# Patient Record
Sex: Female | Born: 1990
Health system: Southern US, Community
[De-identification: ages and names within clinical notes are randomized; demographics above are authoritative.]

## PROBLEM LIST (undated history)

## (undated) DIAGNOSIS — S83519A Sprain of anterior cruciate ligament of unspecified knee, initial encounter: Secondary | ICD-10-CM

## (undated) HISTORY — PX: NO PAST SURGERIES: SHX2092

## (undated) SURGICAL SUPPLY — 68 items
ANCHOR BUTTON TIGHTROPE W/FC3 (Orthopedic Implant) ×2 IMPLANT
BLADE EXCALIBUR 4.0MM X 13CM (MISCELLANEOUS) ×1
BLADE EXCALIBUR 4.0X13 (MISCELLANEOUS) ×1
BLADE SURG 15 STRL SS (BLADE) ×2
BNDG ELASTIC 6X10 VLCR STRL LF (GAUZE/BANDAGES/DRESSINGS) ×2
BNDG ELASTIC 6X5.8 VLCR STR LF (GAUZE/BANDAGES/DRESSINGS) ×2
BNDG ESMARK 6X9 LF (GAUZE/BANDAGES/DRESSINGS) ×2
BURR OVAL 8 FLU 4.0MM X 13CM (MISCELLANEOUS) ×1
BURR OVAL 8 FLU 4.0X13 (MISCELLANEOUS) ×1
CLOSURE WOUND 1/2 X4 (GAUZE/BANDAGES/DRESSINGS) ×1
COOLER ICEMAN CLASSIC (MISCELLANEOUS) ×2
COVER BACK TABLE 60X90IN (DRAPES) ×2
CUFF TOURN SGL QUICK 34 (TOURNIQUET CUFF) ×2
CUTTER TENSIONER SUT 2-0 0 FBW (INSTRUMENTS) ×2
DRAPE ARTHROSCOPY W/POUCH 90 (DRAPES) ×2
DRAPE IMP U-DRAPE 54X76 (DRAPES) ×4
DRAPE OEC MINIVIEW 54X84 (DRAPES) ×2
DRAPE U-SHAPE 47X51 STRL (DRAPES) ×2
DRSG EMULSION OIL 3X3 NADH (GAUZE/BANDAGES/DRESSINGS) ×2
DRSG PAD ABDOMINAL 8X10 ST (GAUZE/BANDAGES/DRESSINGS) ×2
DURAPREP 26ML APPLICATOR (WOUND CARE) ×2
ELECT REM PT RETURN 9FT ADLT (ELECTROSURGICAL) ×2
FLIPCUTTER III 6-12 AR-1204FF (ORTHOPEDIC DISPOSABLE SUPPLIES) ×2
GAUZE SPONGE 4X4 12PLY STRL (GAUZE/BANDAGES/DRESSINGS) ×2
GLOVE BIOGEL PI INDICATOR 7.0 (GLOVE) ×8
GLOVE ECLIPSE 7.0 STRL STRAW (GLOVE) ×4
GLOVE SKINSENSE NS SZ7.5 (GLOVE) ×4
GLOVE SURG SS PI 7.0 STRL IVOR (GLOVE) ×2
GLOVE SURG SYN 7.5  E (GLOVE) ×4
GOWN STRL REIN XL XLG (GOWN DISPOSABLE) ×2
GOWN STRL REUS W/TWL LRG LVL3 (GOWN DISPOSABLE) ×6
GOWN STRL REUS W/TWL XL LVL3 (GOWN DISPOSABLE) ×2
HARVESTER TENDON QUADPRO 9 (ORTHOPEDIC DISPOSABLE SUPPLIES) ×2
IMMOBILIZER KNEE 22 UNIV (SOFTGOODS) ×2
IMP SYS 2ND FIX PEEK 4.75X19.1 (Miscellaneous) ×2 IMPLANT
IMPL FIBERSTITCH 2-0 CVD 24DEG (Anchor) ×4 IMPLANT
IMPLANT FIBERSTICH 2-0 CVD (Anchor) ×10 IMPLANT
KIT TRANSTIBIAL (DISPOSABLE) ×2
KNEE WRAP E Z 3 GEL PACK (MISCELLANEOUS) ×2
MANIFOLD NEPTUNE II (INSTRUMENTS) ×2
NS IRRIG 1000ML POUR BTL (IV SOLUTION) ×2
PACK ARTHROSCOPY DSU (CUSTOM PROCEDURE TRAY) ×2
PACK BASIN DAY SURGERY FS (CUSTOM PROCEDURE TRAY) ×2
PAD COLD SHLDR WRAP-ON (PAD) ×2
PADDING CAST COTTON 4X4 STRL (CAST SUPPLIES) ×2
PADDING CAST COTTON 6X4 STRL (CAST SUPPLIES) ×2
PENCIL SMOKE EVACUATOR (MISCELLANEOUS) ×2
PORT APPOLLO RF 90DEGREE MULTI (SURGICAL WAND) ×2
PORTAL SKID DEVICE (INSTRUMENTS) ×2
SCREW INTERFERENCE FT BC 9X20 (Screw) ×2 IMPLANT
SHEET MEDIUM DRAPE 40X70 STRL (DRAPES) ×2
SPONGE LAP 18X18 RF (DISPOSABLE) ×2
STRIP CLOSURE SKIN 1/2X4 (GAUZE/BANDAGES/DRESSINGS) ×1
SUCTION FRAZIER HANDLE 10FR (MISCELLANEOUS) ×2
SUT ETHILON 3 0 FSL (SUTURE) ×2
SUT ETHILON 3 0 PS 1 (SUTURE) ×2
SUT FIBERWIRE #2 38 T-5 BLUE (SUTURE) ×2
SUT MNCRL AB 3-0 PS2 27 (SUTURE) ×2
SUT VIC AB 1 CTX 27 (SUTURE) ×2
SUT VIC AB 2-0 CT1 27 (SUTURE) ×8
SUT VIC AB 2-0 SH 27 (SUTURE) ×2
SUT VIC AB 3-0 SH 27 (SUTURE) ×2
SUTURETAPE 1.3 FIBERLOOP 20 ST (SUTURE) ×2
TOWEL GREEN STERILE FF (TOWEL DISPOSABLE) ×4
TUBE CONNECTING 20'X1/4 (TUBING) ×1
TUBE CONNECTING 20X1/4 (TUBING) ×1
TUBING ARTHROSCOPY IRRIG 16FT (MISCELLANEOUS) ×2
WATER STERILE IRR 1000ML POUR (IV SOLUTION) ×2

---

## 2020-05-31 DIAGNOSIS — N6002 Solitary cyst of left breast: Secondary | ICD-10-CM

## 2020-06-01 DIAGNOSIS — N6002 Solitary cyst of left breast: Secondary | ICD-10-CM

## 2020-06-01 DIAGNOSIS — D242 Benign neoplasm of left breast: Secondary | ICD-10-CM

## 2020-08-02 DIAGNOSIS — N6002 Solitary cyst of left breast: Secondary | ICD-10-CM

## 2020-08-02 DIAGNOSIS — N6001 Solitary cyst of right breast: Secondary | ICD-10-CM

## 2020-08-06 DIAGNOSIS — N6002 Solitary cyst of left breast: Secondary | ICD-10-CM

## 2020-08-06 DIAGNOSIS — N6001 Solitary cyst of right breast: Secondary | ICD-10-CM

## 2020-08-13 DIAGNOSIS — M25562 Pain in left knee: Secondary | ICD-10-CM

## 2020-08-13 DIAGNOSIS — G8929 Other chronic pain: Secondary | ICD-10-CM

## 2020-08-13 NOTE — Progress Notes (Signed)
   Office Visit Note   Patient: Kelly Vasquez           Date of Birth: Feb 25, 1991           MRN: [id number] Visit Date: 08/13/2020              Requested by: Avel Sensor, MD [address],  [place] [address] PCP: Avel Sensor, MD   Assessment & Plan: Visit Diagnoses:  1. Chronic pain of left knee     Plan: Impression is left knee medial meniscus tear.  I did discuss cortisone injection versus MRI today.  Due to the longevity of symptoms the patient would like to go ahead and proceed with MRI.  She will follow up with [place] once that has been completed.  Follow-Up Instructions: Return for after MRI.   Orders:  Orders Placed This Encounter  Procedures  . XR KNEE 3 VIEW LEFT   No orders of the defined types were placed in this encounter.     Procedures: No procedures performed   Clinical Data: No additional findings.   Subjective: Chief Complaint  Patient presents with  . 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  This began approximately 8 years ago when she was dancing with her mom.  She remembers twisting her knee and is never been the same since.  She has had intermittent swelling worse while at work and at the end of the day.  She has sharp shooting pains to the medial aspect when she pivots.  She does note occasional instability as well.  She has tried Tylenol without relief of symptoms.  No previous cortisone injection.  Review of Systems as detailed in HPI.  All others reviewed and are negative.   Objective: Vital Signs: Ht 5\' 7"  (1.702 m)   Wt 164 lb (74.4 kg)   BMI 25.69 kg/m   Physical Exam well-developed well-nourished female in no acute distress.  Alert and oriented x3.  Ortho Exam examination of the left knee shows a trace effusion.  Range of motion 0 to 120 degrees.  Marked tenderness to the anterior horn medial meniscus.  Minimal tenderness to the  lateral joint line.  No patellofemoral crepitus.  Ligaments are stable.  She is neurovascular intact distally.  Specialty Comments:  No specialty comments available.  Imaging: XR KNEE 3 VIEW LEFT  Result Date: 08/13/2020 No acute or structural abnormalities    PMFS History: There are no problems to display for this patient.  History reviewed. No pertinent past medical history.  History reviewed. No pertinent family history.  History reviewed. No pertinent surgical history. Social History   Occupational History  . Not on file  Tobacco Use  . Smoking status: Never Smoker  . Smokeless tobacco: Never Used  Substance and Sexual Activity  . Alcohol use: Not on file  . Drug use: Not on file  . Sexual activity: Not on file

## 2020-09-03 DIAGNOSIS — M25562 Pain in left knee: Secondary | ICD-10-CM

## 2020-09-03 NOTE — Progress Notes (Signed)
F/u to discuss

## 2020-09-03 NOTE — Telephone Encounter (Signed)
Patient called she stated tramadol isn't strong enough for pain she is requesting something stronger for pain call back [phone].

## 2020-09-03 NOTE — Addendum Note (Signed)
Addended by: Hortencia Pilar on: 09/03/2020 01:18 PM   Modules accepted: Orders

## 2020-09-03 NOTE — Telephone Encounter (Signed)
I spoke with patient and advised of below per Dr Junius Roads. She states she is not sure that she is able to have surgery right now since she just started her job 6 months ago and is getting married in November. She will further discuss at her follow up appt.

## 2020-09-03 NOTE — Telephone Encounter (Signed)
Will call in an anti-inflammatory.  Best to not use stronger narcotics before surgery because if her body gets used to them, then it will be hard to control pain after surgery (presuming she does indeed need surgery).

## 2020-09-03 NOTE — Telephone Encounter (Signed)
Can you advise since Ria Comment and Erlinda Hong out of office?

## 2020-09-14 DIAGNOSIS — S83242A Other tear of medial meniscus, current injury, left knee, initial encounter: Secondary | ICD-10-CM | POA: Diagnosis not present

## 2020-09-14 DIAGNOSIS — S83512A Sprain of anterior cruciate ligament of left knee, initial encounter: Secondary | ICD-10-CM | POA: Insufficient documentation

## 2020-09-14 NOTE — Telephone Encounter (Signed)
I just sent in

## 2020-09-14 NOTE — Telephone Encounter (Signed)
Patient called she stated pain is still there , she stated she cant pivot and when getting out of the car she fell she is requesting pain meds. She stated the tramadol isn't working and she has to work Midwife.call back [phone]

## 2020-09-14 NOTE — Telephone Encounter (Signed)
yes

## 2020-09-14 NOTE — Telephone Encounter (Signed)
Answered her questions

## 2020-09-14 NOTE — Telephone Encounter (Signed)
Patient has questions regarding surgery. Would like a callback.  CB # [phone]

## 2020-09-14 NOTE — Telephone Encounter (Signed)
Needs a note that she will have quarantine 3 days prior to SU. Include SU date (Oct 1st) in Note. Needs note to protect her from not loosing her job. She has been working there for only 6 months. And cannot do FMLA until she has been there for 12 months. She is really scared to loose her job. But needs to get the SU done ASAP because she does not want to have a knee replacement      Would like this emailed to [email or web address]

## 2020-09-14 NOTE — Progress Notes (Signed)
Office Visit Note   Patient: Kelly Vasquez           Date of Birth: 04-01-1991           MRN: [id number] Visit Date: 09/14/2020              Requested by: Avel Sensor, MD [address],  [place] [address] PCP: Avel Sensor, MD   Assessment & Plan: Visit Diagnoses:  1. Rupture of anterior cruciate ligament of left knee, initial encounter   2. Acute medial meniscus tear of left knee, initial encounter     Plan: Impression is chronic ACL tear and medial meniscal tear with questionable lateral meniscal tear.  These findings were reviewed with the patient in detail and a lengthy discussion on treatment options and I have recommended ACL reconstruction with medial meniscal repair versus debridement and evaluation of the lateral meniscus.  Risk benefits rehab recovery reviewed in detail.  Patient denies a history of DVT.  Anticipate return to work without restrictions at 3 to 6 months possibly sooner with restrictions.  Total face to face encounter time was greater than 25 minutes and over half of this time was spent in counseling and/or coordination of care.  Follow-Up Instructions: Return if symptoms worsen or fail to improve.   Orders:  No orders of the defined types were placed in this encounter.  No orders of the defined types were placed in this encounter.     Procedures: No procedures performed   Clinical Data: No additional findings.   Subjective: Chief Complaint  Patient presents with  . Left Knee - Pain    Kelly Vasquez comes in today for MRI review of the left knee.  She continues to have symptoms of buckling and instability especially with pivoting and planting.  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.  She states that it is very difficult for her to perform her job responsibilities because of the left knee symptoms.   Review of Systems  Constitutional: Negative.   HENT: Negative.   Eyes: Negative.   Respiratory: Negative.   Cardiovascular:  Negative.   Endocrine: Negative.   Musculoskeletal: Negative.   Neurological: Negative.   Hematological: Negative.   Psychiatric/Behavioral: Negative.   All other systems reviewed and are negative.    Objective: Vital Signs: Ht 5\' 7"  (1.702 m)   Wt 160 lb (72.6 kg)   BMI 25.06 kg/m   Physical Exam Vitals and nursing note reviewed.  Constitutional:      Appearance: She is well-developed.  Pulmonary:     Effort: Pulmonary effort is normal.  Skin:    General: Skin is warm.     Capillary Refill: Capillary refill takes less than 2 seconds.  Neurological:     Mental Status: She is alert and oriented to person, place, and time.  Psychiatric:        Behavior: Behavior normal.        Thought Content: Thought content normal.        Judgment: Judgment normal.     Ortho Exam Left knee shows a positive pivot shift.  Soft endpoint with Lachman and anterior drawer.  No joint effusion.  Normal range of motion.  Medial joint line tenderness. Specialty Comments:  No specialty comments available.  Imaging: No results found.   PMFS History: Patient Active Problem List   Diagnosis Date Noted  . Left anterior cruciate ligament tear 09/14/2020  . Acute medial meniscus tear of  left knee 09/14/2020   History reviewed. No pertinent past medical history.  History reviewed. No pertinent family history.  History reviewed. No pertinent surgical history. Social History   Occupational History  . Not on file  Tobacco Use  . Smoking status: Never Smoker  . Smokeless tobacco: Never Used  Substance and Sexual Activity  . Alcohol use: Not on file  . Drug use: Not on file  . Sexual activity: Not on file

## 2020-09-15 NOTE — Telephone Encounter (Signed)
Patient called in wanting note for documentation to be out of work 24th-30th because she has to quarantine before her surgery . Would like letter to be emailed.   [email or web address]

## 2020-09-15 NOTE — Telephone Encounter (Signed)
yes

## 2020-09-15 NOTE — Telephone Encounter (Signed)
Note emailed per patients request.

## 2020-09-16 NOTE — Telephone Encounter (Signed)
Called and sw pt and advised that letter will be at the front desk for pick up today.

## 2020-09-16 NOTE — Telephone Encounter (Signed)
Another letter written and available for pick up.

## 2020-09-16 NOTE — Telephone Encounter (Signed)
Patient called in again saying her job just called her & wants to also include how long will she be out after the surgery.

## 2020-09-16 NOTE — Telephone Encounter (Signed)
Called and sw pt and she said specifically needs the dates listed below in note. Advised this will be done today and available for pick up at the front desk.

## 2020-09-16 NOTE — Telephone Encounter (Signed)
Patient called back in saying she hasnt  received the letter for work and they need it today , says she can also come pick it up today . Call her when document is ready for pick up.

## 2020-09-17 NOTE — Progress Notes (Addendum)
      Enhanced Recovery after Surgery for Orthopedics Enhanced Recovery after Surgery is a protocol used to improve the stress on your body and your recovery after surgery.  Patient Instructions  . The night before surgery:  o No food after midnight. ONLY clear liquids after midnight  . The day of surgery (if you do NOT have diabetes):  o Drink ONE (1) Pre-Surgery Clear Ensure as directed.   o This drink was given to you during your hospital  pre-op appointment visit. o The pre-op nurse will instruct you on the time to drink the  Pre-Surgery Ensure depending on your surgery time. o Finish the drink at the designated time by the pre-op nurse.  o Nothing else to drink after completing the  Pre-Surgery Clear Ensure.  . The day of surgery (if you have diabetes): o Drink ONE (1) Gatorade 2 (G2) as directed. o This drink was given to you during your hospital  pre-op appointment visit.  o The pre-op nurse will instruct you on the time to drink the   Gatorade 2 (G2) depending on your surgery time. o Color of the Gatorade may vary. Red is not allowed. o Nothing else to drink after completing the  Gatorade 2 (G2).           Patient given CHG pre-surgical soap. Patient verbalized understanding.  If you have questions, please contact your surgeon's office.

## 2020-09-20 NOTE — Telephone Encounter (Signed)
[employer] forms received. Sent to [employer]. 

## 2020-09-21 DIAGNOSIS — Z01812 Encounter for preprocedural laboratory examination: Secondary | ICD-10-CM | POA: Diagnosis present

## 2020-09-21 DIAGNOSIS — Z20822 Contact with and (suspected) exposure to covid-19: Secondary | ICD-10-CM | POA: Diagnosis not present

## 2020-09-24 DIAGNOSIS — X58XXXA Exposure to other specified factors, initial encounter: Secondary | ICD-10-CM | POA: Diagnosis not present

## 2020-09-24 DIAGNOSIS — Z791 Long term (current) use of non-steroidal anti-inflammatories (NSAID): Secondary | ICD-10-CM | POA: Insufficient documentation

## 2020-09-24 DIAGNOSIS — S83212A Bucket-handle tear of medial meniscus, current injury, left knee, initial encounter: Secondary | ICD-10-CM | POA: Diagnosis not present

## 2020-09-24 DIAGNOSIS — S83252A Bucket-handle tear of lateral meniscus, current injury, left knee, initial encounter: Secondary | ICD-10-CM | POA: Diagnosis not present

## 2020-09-24 DIAGNOSIS — S83272A Complex tear of lateral meniscus, current injury, left knee, initial encounter: Secondary | ICD-10-CM

## 2020-09-24 DIAGNOSIS — Z79899 Other long term (current) drug therapy: Secondary | ICD-10-CM | POA: Diagnosis not present

## 2020-09-24 DIAGNOSIS — S83512A Sprain of anterior cruciate ligament of left knee, initial encounter: Secondary | ICD-10-CM

## 2020-09-24 DIAGNOSIS — S83282A Other tear of lateral meniscus, current injury, left knee, initial encounter: Secondary | ICD-10-CM | POA: Diagnosis present

## 2020-09-24 DIAGNOSIS — S83242A Other tear of medial meniscus, current injury, left knee, initial encounter: Secondary | ICD-10-CM | POA: Diagnosis present

## 2020-09-24 DIAGNOSIS — Y939 Activity, unspecified: Secondary | ICD-10-CM | POA: Diagnosis not present

## 2020-09-24 HISTORY — PX: KNEE ARTHROSCOPY WITH ANTERIOR CRUCIATE LIGAMENT (ACL) REPAIR WITH HAMSTRING GRAFT: SHX5645

## 2020-09-24 HISTORY — DX: Sprain of anterior cruciate ligament of unspecified knee, initial encounter: S83.519A

## 2020-09-24 NOTE — Discharge Instructions (Signed)
Post-operative patient instructions Knee ACL Reconstruction   Left knee ACL reconstruction with medial meniscus repair and lateral meniscus debridement.  Ice: Place intermittent ice or cooler pack over your knee, 30 minutes on and 30 minutes off. Continue this for the first 72 hours after surgery, then save ice for use after therapy sessions or on more active days.  Weight: You may place weight on your leg as your symptoms allow (with brace and crutches)  Brace: You have a knee brace locked holding your knee straight placed over your surgical dressings. Keep brace on for walking until physical therapist or physician sees your strength and leg control improve and discontinues brace. Wear brace also at night to help with knee straightening.  Crutches: Use crutches to assist in walking until told to discontinue by your physical therapist or physician. This will help to reduce pain.  Strengthening: Perform simple thigh squeezes (isometric quad contractions) and straight leg lifts as you are able (3 sets of 5 to 10 repetitions, 3 times a day). For the leg lifts, have someone support under your ankle in the beginning until you have increased strength enough to do this on your own. To help get started on thigh squeezes, place a pillow under your knee and push down on the pillow with back of knee (sometimes easier to do than with your leg fully straight).  Motion: Perform gentle knee motion as tolerated - this is gentle bending and straightening of the knee. Seated heel slides: you can start by sitting in a chair, remove your brace, and gently slide your heel back on the floor - allowing your knee to bend. Have someone help you straighten your knee (or use your other leg/foot hooked under your ankle. Also spend time working on knee straightening by placing a folded towel under your foot/heel and allow gravity to help knee fall straight (5-10 min at a time 3-4 x per day)  Dressing: Perform 1st dressing change  at 4-5 days postoperative. A moderate amount of blood tinged drainage is to be expected. So if you bleed through the dressing on the first or second day or if you have fevers, it is fine to change the dressing/check the wounds early, recover with new gauze and tegaderm (water resistant) as shown by MD, and rewrap with an ace bandage. Elevate your leg. If it bleeds through again, or if the incisions are leaking frank blood, please call the office. May change dressing every 1-2 days thereafter to check wound appearance. Many pharmacies have a similar water resistant dressing (ex 68M Nexcare).  Shower: Keep wounds dry and covered x 14 days. Do not get wound wet until sutures removed. MD has provided you with initial tegaderm dressing supplies to place over simple dry gauze applied to incisions.  Pain medication: A narcotic pain medication has been prescribed. Take as directed. Typically you need narcotic pain medication more regularly during the first 3 to 5 days after surgery. Decrease your use of the medication as the pain improves. Narcotics can sometimes cause constipation, even after a few doses. If you have problems with constipation, you can take an over the counter stool softener or light laxative. If you have persistent problems, please notify your physician's office.  Physical therapy: Additonal activity guidelines to be provided by your physician or physical therapist at follow-up visits.  Call [phone] for questions or problems. Evenings you will be forwarded to the hospital operator. Ask for the orthopaedic physician on call. Please call if you experience:  Redness, cloudy, or foul smelling drainage at the surgical site  worsening knee pain and swelling not responsive to medication  any calf pain and or swelling of the lower leg and foot  medication intolerance  temperature greater than 100.5*F, especially during the first 3-4 weeks post surgical  other questions or concerns Thank you for  allowing [place] to be a part of your care.    *1,000mg  Tylenol taken at 11:19pm*    Post Anesthesia Home Care Instructions  Activity: Get plenty of rest for the remainder of the day. A responsible individual must stay with you for 24 hours following the procedure.  For the next 24 hours, DO NOT: -Drive a car -Paediatric nurse -Drink alcoholic beverages -Take any medication unless instructed by your physician -Make any legal decisions or sign important papers.  Meals: Start with liquid foods such as gelatin or soup. Progress to regular foods as tolerated. Avoid greasy, spicy, heavy foods. If nausea and/or vomiting occur, drink only clear liquids until the nausea and/or vomiting subsides. Call your physician if vomiting continues.  Special Instructions/Symptoms: Your throat may feel dry or sore from the anesthesia or the breathing tube placed in your throat during surgery. If this causes discomfort, gargle with warm salt water. The discomfort should disappear within 24 hours.  If you had a scopolamine patch placed behind your ear for the management of post- operative nausea and/or vomiting:  1. The medication in the patch is effective for 72 hours, after which it should be removed.  Wrap patch in a tissue and discard in the trash. Wash hands thoroughly with soap and water. 2. You may remove the patch earlier than 72 hours if you experience unpleasant side effects which may include dry mouth, dizziness or visual disturbances. 3. Avoid touching the patch. Wash your hands with soap and water after contact with the patch.    Regional Anesthesia Blocks  1. Numbness or the inability to move the "blocked" extremity may last from 3-48 hours after placement. The length of time depends on the medication injected and your individual response to the medication. If the numbness is not going away after 48 hours, call your surgeon.  2. The extremity that is blocked will need to be protected until the  numbness is gone and the  Strength has returned. Because you cannot feel it, you will need to take extra care to avoid injury. Because it may be weak, you may have difficulty moving it or using it. You may not know what position it is in without looking at it while the block is in effect.  3. For blocks in the legs and feet, returning to weight bearing and walking needs to be done carefully. You will need to wait until the numbness is entirely gone and the strength has returned. You should be able to move your leg and foot normally before you try and bear weight or walk. You will need someone to be with you when you first try to ensure you do not fall and possibly risk injury.  4. Bruising and tenderness at the needle site are common side effects and will resolve in a few days.  5. Persistent numbness or new problems with movement should be communicated to the surgeon or the [facility] [place] [phone] [place] [phone]).

## 2020-09-24 NOTE — Progress Notes (Signed)
Assisted Dr. Carswell Jackson with left, ultrasound guided, adductor canal block. Side rails up, monitors on throughout procedure. See vital signs in flow sheet. Tolerated Procedure well.  

## 2020-09-24 NOTE — Telephone Encounter (Signed)
[employer] forms received. Sent to [employer]. 

## 2020-09-24 NOTE — Telephone Encounter (Signed)
Called patient no answer LMOM to return our call. We need to know what specific questions she has. She can always send [place] a [employer].

## 2020-09-24 NOTE — Transfer of Care (Signed)
Immediate Anesthesia Transfer of Care Note  Patient: Oncologist  Procedure(s) Performed: LEFT KNEE ARTHROSCOPY WITH ANTERIOR CRUCIATE LIGAMENT (ACL) RECONSTRUCTION, PARTIAL MEDIAL MENISCUS REPAIR (Left )  Patient Location: PACU  Anesthesia Type:GA combined with regional for post-op pain  Level of Consciousness: awake, alert , oriented and patient cooperative  Airway & Oxygen Therapy: Patient Spontanous Breathing and Patient connected to nasal cannula oxygen  Post-op Assessment: Report given to RN, Post -op Vital signs reviewed and stable and Patient moving all extremities  Post vital signs: Reviewed and stable  Last Vitals:  Vitals Value Taken Time  BP 121/74 09/24/20 1644  Temp    Pulse 69 09/24/20 1645  Resp 15 09/24/20 1645  SpO2 100 % 09/24/20 1645  Vitals shown include unvalidated device data.  Last Pain:  Vitals:   09/24/20 0849  TempSrc: Oral  PainSc: 0-No pain      Patients Stated Pain Goal: 3 (03/14/21 4825)  Complications: No complications documented.

## 2020-09-24 NOTE — Anesthesia Preprocedure Evaluation (Addendum)
Anesthesia Evaluation  Patient identified by MRN, date of birth, ID band Patient awake    Reviewed: Allergy & Precautions, NPO status , Patient's Chart, lab work & pertinent test results  History of Anesthesia Complications Negative for: history of anesthetic complications  Airway Mallampati: I  TM Distance: >3 FB Neck ROM: Full    Dental  (+) Dental Advisory Given, Teeth Intact   Pulmonary neg pulmonary ROS,  09/21/2020 SARS coronavirus NEG   breath sounds clear to auscultation       Cardiovascular negative cardio ROS   Rhythm:Regular Rate:Normal     Neuro/Psych negative neurological ROS     GI/Hepatic negative GI ROS, Neg liver ROS,   Endo/Other  negative endocrine ROS  Renal/GU negative Renal ROS     Musculoskeletal   Abdominal   Peds  Hematology negative hematology ROS (+)   Anesthesia Other Findings   Reproductive/Obstetrics LMP 09/07/2020                             Anesthesia Physical Anesthesia Plan  ASA: I  Anesthesia Plan: General   Post-op Pain Management: GA combined w/ Regional for post-op pain   Induction: Intravenous  PONV Risk Score and Plan: 3 and Ondansetron, Dexamethasone and Scopolamine patch - Pre-op  Airway Management Planned: LMA  Additional Equipment: None  Intra-op Plan:   Post-operative Plan:   Informed Consent: I have reviewed the patients History and Physical, chart, labs and discussed the procedure including the risks, benefits and alternatives for the proposed anesthesia with the patient or authorized representative who has indicated his/her understanding and acceptance.     Dental advisory given  Plan Discussed with: CRNA and Surgeon  Anesthesia Plan Comments: (Plan routine monitors, GA with adductor canal block for post op analgesia)       Anesthesia Quick Evaluation

## 2020-09-24 NOTE — Anesthesia Procedure Notes (Signed)
Procedure Name: LMA Insertion Date/Time: 09/24/2020 12:51 PM Performed by: Myna Bright, CRNA Pre-anesthesia Checklist: Patient identified, Emergency Drugs available, Suction available and Patient being monitored Patient Re-evaluated:Patient Re-evaluated prior to induction Oxygen Delivery Method: Circle system utilized Preoxygenation: Pre-oxygenation with 100% oxygen Induction Type: IV induction Ventilation: Mask ventilation without difficulty LMA: LMA inserted LMA Size: 4.0 Tube type: Oral Number of attempts: 1 Placement Confirmation: positive ETCO2 and breath sounds checked- equal and bilateral Tube secured with: Tape Dental Injury: Teeth and Oropharynx as per pre-operative assessment

## 2020-09-24 NOTE — H&P (Signed)
PREOPERATIVE H&P  Chief Complaint: left knee anterior cruciate ligament tear, medial meniscal tear, lateral meniscal tear  HPI: Kelly Vasquez is a 29 y.o. female who presents for surgical treatment of left knee anterior cruciate ligament tear, medial meniscal tear, lateral meniscal tear.  She denies any changes in medical history.  Past Medical History:  Diagnosis Date  . ACL tear    Past Surgical History:  Procedure Laterality Date  . NO PAST SURGERIES     Social History   Socioeconomic History  . Marital status: Single    Spouse name: Not on file  . Number of children: Not on file  . Years of education: Not on file  . Highest education level: Not on file  Occupational History  . Not on file  Tobacco Use  . Smoking status: Never Smoker  . Smokeless tobacco: Never Used  Vaping Use  . Vaping Use: Never used  Substance and Sexual Activity  . Alcohol use: Yes    Comment: occas  . Drug use: Never  . Sexual activity: Not on file  Other Topics Concern  . Not on file  Social History Narrative  . Not on file   Social Determinants of Health   Financial Resource Strain:   . Difficulty of Paying Living Expenses: Not on file  Food Insecurity:   . Worried About Charity fundraiser in the Last Year: Not on file  . Ran Out of Food in the Last Year: Not on file  Transportation Needs:   . Lack of Transportation (Medical): Not on file  . Lack of Transportation (Non-Medical): Not on file  Physical Activity:   . Days of Exercise per Week: Not on file  . Minutes of Exercise per Session: Not on file  Stress:   . Feeling of Stress : Not on file  Social Connections:   . Frequency of Communication with Friends and Family: Not on file  . Frequency of Social Gatherings with Friends and Family: Not on file  . Attends Religious Services: Not on file  . Active Member of Clubs or Organizations: Not on file  . Attends Archivist Meetings: Not on file  . Marital Status: Not  on file   History reviewed. No pertinent family history. No Known Allergies Prior to Admission medications   Medication Sig Start Date End Date Taking? Authorizing Provider  HYDROcodone-acetaminophen (NORCO) 5-325 MG tablet Take 1-2 tablets by mouth daily as needed. 09/14/20  Yes Aundra Dubin, PA-C  diclofenac (VOLTAREN) 75 MG EC tablet Take 1 tablet (75 mg total) by mouth 2 (two) times daily as needed. 08/13/20   Aundra Dubin, PA-C  traMADol (ULTRAM) 50 MG tablet Take 1 tablet (50 mg total) by mouth 2 (two) times daily as needed. 08/13/20   Aundra Dubin, PA-C     Positive ROS: All other systems have been reviewed and were otherwise negative with the exception of those mentioned in the HPI and as above.  Physical Exam: General: Alert, no acute distress Cardiovascular: No pedal edema Respiratory: No cyanosis, no use of accessory musculature GI: abdomen soft Skin: No lesions in the area of chief complaint Neurologic: Sensation intact distally Psychiatric: Patient is competent for consent with normal mood and affect Lymphatic: no lymphedema  MUSCULOSKELETAL: exam stable  Assessment: left knee anterior cruciate ligament tear, medial meniscal tear, lateral meniscal tear  Plan: Plan for Procedure(s): LEFT KNEE ARTHROSCOPY WITH ANTERIOR CRUCIATE LIGAMENT (ACL) RECONSTRUCTION, PARTIAL MEDIAL AND LATERAL MENISCECTOMY VS.  REPAIR  The risks benefits and alternatives were discussed with the patient including but not limited to the risks of nonoperative treatment, versus surgical intervention including infection, bleeding, nerve injury,  blood clots, cardiopulmonary complications, morbidity, mortality, among others, and they were willing to proceed.   Preoperative templating of the joint replacement has been completed, documented, and submitted to the Operating Room personnel in order to optimize intra-operative equipment management.   Eduard Roux, MD 09/24/2020 8:20 AM

## 2020-09-24 NOTE — Op Note (Signed)
Date of Surgery: 09/24/2020  INDICATIONS: Ms. Kelly Vasquez is a 29 y.o.-year-old female with a left knee injury who presents today for surgical treatment..  The patient did consent to the procedure after discussion of the risks and benefits.  PREOPERATIVE DIAGNOSIS:  1.  Complete rupture of left anterior cruciate ligament 2.  Bucket-handle tear of left medial meniscus 3.  Complex tear of the lateral meniscus posterior horn  POSTOPERATIVE DIAGNOSIS: Same.  PROCEDURE:  1.  Examination of left knee joint under general anesthesia 2.  Repair of left medial meniscal tear 3.  Partial left lateral meniscectomy 4.  Arthroscopically assisted reconstruction of left anterior cruciate ligament with quadriceps autograft  SURGEON: N. Eduard Roux, M.D.  ASSIST: Ciro Backer [place], [place]; necessary for the timely completion of procedure and due to complexity of procedure.  ANESTHESIA:  general  IV FLUIDS AND URINE: See anesthesia.  ESTIMATED BLOOD LOSS: 50 mL.  IMPLANTS:  1.  Arthrex fiber tag tight rope femoral flip button 2.  Arthrex 9 x 20 fast thread interference screw 3.  4.75 mm swivel lock 4.  Arthrex fiber stitch all inside meniscal device x 2  DRAINS: None  COMPLICATIONS: see description of procedure.  DESCRIPTION OF PROCEDURE: The patient was brought to the operating room.  The patient had been signed prior to the procedure and this was documented. The patient had the anesthesia placed by the anesthesiologist.  A time-out was performed to confirm that this was the correct patient, site, side and location. The patient did receive antibiotics prior to the incision and was re-dosed during the procedure as needed at indicated intervals.  A tourniquet was placed on the upper thigh.  The patient had the operative extremity prepped and draped in the standard surgical fashion.    We first began with examination of the knee joint under general anesthesia.  She had abnormal pivot shift, Lachman,  anterior drawer with no endpoint.  Collateral exam was normal.  Posterior lateral corner was normal. We then turned our attention to the surgical portion.  Incisions were made for standard knee arthroscopy portals.  Diagnostic knee arthroscopy was performed and revealed a bucket handle tear of the medial meniscus that was unstable to probing.  Lateral meniscus demonstrated a small complex tear the posterior horn with a horizontal cleavage component.  Limited synovectomy was performed for visualization.  The knee was placed in a figure 4 position and a partial lateral meniscectomy was performed using a meniscus basket and oscillating shaver back to stable border.  Less than 20% of the volume of the lateral meniscus was removed.  She did demonstrate grade 2 changes of the lateral femoral condyle of the weightbearing surface.  The patellofemoral compartment was unremarkable.  The anterior cruciate ligament was fully ruptured and the stump was debrided away.  Gentle valgus force was then placed on the left knee to address the medial compartment.  The bucket-handle tear extended from the mid body back to the posterior horn when probed.  An all inside meniscal repair was performed using the Arthrex fiber stitch meniscal device.  We used 2 devices in order to deploy 4 anchors.  The meniscus reduced quite nicely and was stable to probing afterwards.  She demonstrated a grade 3 changes of the medial femoral condyle of the weightbearing surface.  Gentle chondroplasty was performed in this area.  We then turned our attention to the ACL reconstruction.  Incision was made directly over the distal quadriceps and the knee was placed on a  radiolucent triangle.  Dissection was carried down through the subcutaneous tissue and the soft tissue was cleared off of the quadriceps tendon.  A 9.5 by 80 mm quadriceps graft was obtained.  The defect was then repaired using interrupted #1 Vicryl sutures.  The harvest site was closed in a  layered fashion.  The ACL graft was then prepared on the back table by my PA who was critical for all parts of the surgery.  The arthroscope was then placed back into the knee and femoral notchplasty was performed.  The femoral tunnel was then drilled with a flip cutter at a 9.5 mm diameter and a depth of 25 mm.  The tibial tunnel was then drilled at 45mm.  The graft was then passed transtibially under arthroscopic visualization.  The femoral button was flipped and confirmed under fluoroscopy.  The ACL graft was then advanced into the femoral tunnel to the proper depth.  The knee was then put through 10 cycles.  With proper tension the interference screw was placed into the tibial tunnel with excellent fixation.  The tibial fixation was then backed up with a 4.75 mm swivel lock into the tibial cortex with excellent fixation.  All wounds were then thoroughly irrigated.  Examination of the knee revealed a negative pivot shift and a normal Lachman and anterior drawer test with solid endpoint.  All surgical wounds were closed in a layered fashion.  Sterile dressings were applied.  Patient tolerated procedure well had no immediate complications.  POSTOPERATIVE PLAN: Weight-bear as tolerated in knee immobilizer and crutches.  Follow-up next week for wound check.  Azucena Cecil, MD 7:00 PM

## 2020-09-24 NOTE — Anesthesia Procedure Notes (Signed)
Anesthesia Regional Block: Adductor canal block   Pre-Anesthetic Checklist: ,, timeout performed, Correct Patient, Correct Site, Correct Laterality, Correct Procedure, Correct Position, site marked, Risks and benefits discussed,  Surgical consent,  Pre-op evaluation,  At surgeon's request and post-op pain management  Laterality: Left and Lower  Prep: chloraprep       Needles:  Injection technique: Single-shot  Needle Type: Echogenic Needle     Needle Length: 9cm  Needle Gauge: 21     Additional Needles:   Procedures:,,,, ultrasound used (permanent image in chart),,,,  Narrative:  Start time: 09/24/2020 11:19 AM End time: 09/24/2020 11:25 AM Injection made incrementally with aspirations every 5 mL.  Performed by: Personally  Anesthesiologist: Annye Asa, MD  Additional Notes: Pt identified in Holding room.  Monitors applied. Working IV access confirmed. Sterile prep L thigh.  #21ga ECHOgenic Arrow block needle into adductor canal with US guidance.  20cc 0.75% Ropivacaine injected incrementally after negative test dose.  Patient asymptomatic, VSS, no heme aspirated, tolerated well.  Jenita Seashore, MD

## 2020-09-25 NOTE — Anesthesia Postprocedure Evaluation (Signed)
Anesthesia Post Note  Patient: Oncologist  Procedure(s) Performed: LEFT KNEE ARTHROSCOPY WITH ANTERIOR CRUCIATE LIGAMENT (ACL) RECONSTRUCTION, PARTIAL MEDIAL MENISCUS REPAIR (Left )     Patient location during evaluation: PACU Anesthesia Type: General Level of consciousness: awake and alert Pain management: pain level controlled Vital Signs Assessment: post-procedure vital signs reviewed and stable Respiratory status: spontaneous breathing, nonlabored ventilation, respiratory function stable and patient connected to nasal cannula oxygen Cardiovascular status: blood pressure returned to baseline and stable Postop Assessment: no apparent nausea or vomiting Anesthetic complications: no   No complications documented.  Last Vitals:  Vitals:   09/24/20 1730 09/24/20 1746  BP: 110/80 112/82  Pulse: 77 73  Resp: 20 18  Temp:  36.4 C  SpO2: 100% 100%    Last Pain:  Vitals:   09/24/20 1746  TempSrc:   PainSc: [place] Kelly Vasquez

## 2020-09-27 NOTE — Telephone Encounter (Signed)
Toradol cannot be refilled as it is not good to take for more than 5 days continuously.  I'll send in advill 800 mg instead

## 2020-09-29 NOTE — Telephone Encounter (Signed)
See message. Please call patient. She is requesting CB from Dr. Erlinda Hong.

## 2020-09-29 NOTE — Telephone Encounter (Signed)
Called her and answered her questions

## 2020-10-01 DIAGNOSIS — S83512A Sprain of anterior cruciate ligament of left knee, initial encounter: Secondary | ICD-10-CM

## 2020-10-01 NOTE — Progress Notes (Signed)
   Post-Op Visit Note   Patient: Kelly Vasquez           Date of Birth: 09-23-1991           MRN: [id number] Visit Date: 10/01/2020 PCP: Avel Sensor, MD   Assessment & Plan:  Chief Complaint:  Chief Complaint  Patient presents with  . Left Knee - Follow-up    Left knee scope with ACL reconstruction 09/24/2020   Visit Diagnoses:  1. Rupture of anterior cruciate ligament of left knee, initial encounter     Plan: Patient is a pleasant 29 year old female who comes in today 1 week out left knee arthroscopic debridement, medial meniscus repair and ACL reconstruction with quadriceps tendon autograft.  She has been doing fairly well.  She has not been wearing her knee immobilizer.  She has been elevating and icing for pain and swelling.  Examination of her left knee reveals moderate swelling.  Well-healed surgical incisions without complication.  She does have decreased sensation to the shin.  EHL/FHL intact.  Calf is soft nontender.  She is neurovascular intact distally.  At this point, she has been instructed to wear her knee immobilizer.  She will continue to ice and elevate for pain and swelling.  We will go ahead and start her in physical therapy and allow them to wean her out of the knee immobilizer when she has sufficient quadricep strength and is able to straight leg raise.  I have refilled her Norco.  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  I told her that we can keep her out of work through 1 January.  She is submitted paperwork to our office so we will fill that out appropriately.  Follow-up with [place] in 5 weeks time for recheck.  Call with concerns.  Follow-Up Instructions: Return in about 5 weeks (around 11/05/2020).   Orders:  Orders Placed This Encounter  Procedures  . Ambulatory referral to Physical Therapy   Meds ordered this encounter  Medications  . HYDROcodone-acetaminophen (NORCO) 5-325 MG tablet    Sig:  Take 1 tablet by mouth 3 (three) times daily as needed.    Dispense:  30 tablet    Refill:  0    Imaging: No new imaging  PMFS History: Patient Active Problem List   Diagnosis Date Noted  . Left anterior cruciate ligament tear 09/14/2020  . Acute medial meniscus tear of left knee 09/14/2020   Past Medical History:  Diagnosis Date  . ACL tear     History reviewed. No pertinent family history.  Past Surgical History:  Procedure Laterality Date  . KNEE ARTHROSCOPY WITH ANTERIOR CRUCIATE LIGAMENT (ACL) REPAIR WITH HAMSTRING GRAFT Left 09/24/2020   Procedure: LEFT KNEE ARTHROSCOPY WITH ANTERIOR CRUCIATE LIGAMENT (ACL) RECONSTRUCTION, PARTIAL MEDIAL MENISCUS REPAIR;  Surgeon: Leandrew Koyanagi, MD;  Location: [facility];  Service: Orthopedics;  Laterality: Left;  . NO PAST SURGERIES     Social History   Occupational History  . Not on file  Tobacco Use  . Smoking status: Never Smoker  . Smokeless tobacco: Never Used  Vaping Use  . Vaping Use: Never used  Substance and Sexual Activity  . Alcohol use: Yes    Comment: occas  . Drug use: Never  . Sexual activity: Not on file

## 2020-10-04 DIAGNOSIS — M25562 Pain in left knee: Secondary | ICD-10-CM

## 2020-10-04 DIAGNOSIS — R2689 Other abnormalities of gait and mobility: Secondary | ICD-10-CM | POA: Diagnosis not present

## 2020-10-04 DIAGNOSIS — R2681 Unsteadiness on feet: Secondary | ICD-10-CM

## 2020-10-04 DIAGNOSIS — M6281 Muscle weakness (generalized): Secondary | ICD-10-CM

## 2020-10-04 DIAGNOSIS — M25662 Stiffness of left knee, not elsewhere classified: Secondary | ICD-10-CM

## 2020-10-04 DIAGNOSIS — R600 Localized edema: Secondary | ICD-10-CM | POA: Diagnosis not present

## 2020-10-04 NOTE — Therapy (Signed)
[facility] [address] [place], [place], [address] Phone: [phone]   Fax:  [phone]  Physical Therapy Evaluation  Patient Details  Name: Kelly Vasquez MRN: [id number] Date of Birth: 07/17/1991 Referring Provider (PT): Dr. Erlinda Hong   Encounter Date: 10/04/2020   PT End of Session - 10/04/20 1419    Visit Number 1    Number of Visits 16    Date for PT Re-Evaluation 11/29/20    PT Start Time 1342    PT Stop Time 1415    PT Time Calculation (min) 33 min    Activity Tolerance Patient tolerated treatment well    Behavior During Therapy [facility] for tasks assessed/performed           Past Medical History:  Diagnosis Date  . ACL tear     Past Surgical History:  Procedure Laterality Date  . KNEE ARTHROSCOPY WITH ANTERIOR CRUCIATE LIGAMENT (ACL) REPAIR WITH HAMSTRING GRAFT Left 09/24/2020   Procedure: LEFT KNEE ARTHROSCOPY WITH ANTERIOR CRUCIATE LIGAMENT (ACL) RECONSTRUCTION, PARTIAL MEDIAL MENISCUS REPAIR;  Surgeon: Leandrew Koyanagi, MD;  Location: [facility];  Service: Orthopedics;  Laterality: Left;  . NO PAST SURGERIES      There were no vitals filed for this visit.    Subjective Assessment - 10/04/20 1344    Subjective Pt is a 29 y/o female who presents to OPPT s/p Lt ACL and medial meniscus repair on 09/24/20.  Pt reports initial injury about 8-9 years ago, with episodes of instability throughout the past few years.    Limitations Standing;Walking    Patient Stated Goals regain motion, back to work, dance at wedding in Nov, sleep on side    Currently in Pain? Yes    Pain Score 5     Pain Location Knee    Pain Orientation Left    Pain Descriptors / Indicators Aching;Numbness    Pain Type Acute pain;Surgical pain    Pain Onset 1 to 4 weeks ago    Pain Frequency Constant    Aggravating Factors  bending, extension    Pain Relieving Factors motrin              [facility] PT Assessment - 10/04/20 1340      Assessment    Medical Diagnosis Lt ACL repair, medial meniscus repair    Referring Provider (PT) Dr. Erlinda Hong    Onset Date/Surgical Date 09/24/20    Hand Dominance Right    Next MD Visit not scheduled    Prior Therapy n/a      Precautions   Required Braces or Orthoses Knee Immobilizer - Left    Knee Immobilizer - Left Discontinue once straight leg raise with < 10 degree lag   pt not using correct KI     Restrictions   Weight Bearing Restrictions No      Balance Screen   Has the patient fallen in the past 6 months Yes    How many times? 1 - when knee buckled prior to surgery    Has the patient had a decrease in activity level because of a fear of falling?  No    Is the patient reluctant to leave their home because of a fear of falling?  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, riding bike' exercise: jogging/riding bike      Cognition   Overall Cognitive Status Within Functional Limits for tasks assessed      Observation/Other Assessments   Observations moderate swelling in Lt knee    Focus on Therapeutic Outcomes (FOTO)  39 (61% limited; predicted 24% limited)      ROM / Strength   AROM / PROM / Strength AROM;PROM;Strength      AROM   AROM Assessment Site Knee    Right/Left Knee Right;Left    Right Knee Extension 3    Right Knee Flexion 135    Left Knee Extension -3    Left Knee Flexion 76      PROM   PROM Assessment Site Knee    Right/Left Knee Left    Left Knee Extension -1    Left Knee Flexion 90      Strength   Overall Strength Comments ~ 5 deg extension lag with SLR, needed 3  reps of AA for pt to perform SLR independently      Ambulation/Gait   Gait Pattern Step-to pattern;Decreased stance time - left;Decreased step length - right;Decreased hip/knee flexion - left;Antalgic    Gait velocity decreased    Gait Comments using knee brace (not KI that MD recommended)                      Objective measurements completed on examination: See above findings.               PT Education - 10/04/20 1419    Education Details HEP    Person(s) Educated Patient    Methods Explanation;Demonstration;Handout    Comprehension Verbalized understanding;Need further instruction            PT Short Term Goals - 10/04/20 1428      PT SHORT TERM GOAL #1   Title demonstrate ability to perform SLR x 10 reps without extensor lag for improved strength to d/c immobilizer    Status New    Target Date 11/01/20             PT Long Term Goals - 10/04/20 1429      PT LONG TERM GOAL #1   Title independent with HEP    Status New    Target Date 11/29/20      PT LONG TERM GOAL #2   Title improve Lt knee AROM0-130 for improved function    Status New    Target Date 11/29/20      PT LONG TERM GOAL #3   Title amb without significant gait deviations for improved mobility    Status New    Target Date 11/29/20      PT LONG TERM GOAL #4   Title perform SL reach test for functional strength assessment    Status New    Target Date 11/29/20      PT LONG TERM GOAL #5   Title FOTO score improved to </= 40% limited for improved function    Status New    Target Date 11/29/20                  Plan - 10/04/20 1420  Clinical Impression Statement Pt is a 29 y/o female who presents to OPPT s/p Lt ACL repair with medial meniscus repair on 09/24/20 for chronic ACL tear.  Pt demonstrates increased post-op swelling, decreased strength and ROM as well as gait abnormalities affecting funtional mobility.  Pt will benefit from PT to address deficits listed.      Personal Factors and Comorbidities Time since onset of injury/illness/exacerbation    Examination-Activity Limitations Sit;Bathing;Bed Mobility;Sleep;Bend;Squat;Stairs;Caring for Others;Stand;Transfers;Lift;Locomotion Level    Examination-Participation Restrictions Cleaning;Community Activity;Driving;Occupation    Stability/Clinical Decision Making Stable/Uncomplicated    Clinical Decision Making Low    Rehab Potential Good    PT Frequency 3x / week   2-3x/wk   PT Duration 8 weeks    PT Treatment/Interventions ADLs/Self Care Home Management;Cryotherapy;Electrical Stimulation;Moist Heat;Ultrasound;Gait training;Stair training;Functional mobility training;Therapeutic activities;Therapeutic exercise;Balance training;Manual techniques;Patient/family education;Neuromuscular re-education;Scar mobilization;Passive range of motion;Taping;Vasopneumatic Device    PT Next Visit Plan BFR, work on ROM    PT Home Exercise Plan Access Code: [email or web address]    Consulted and Agree with Plan of Care Patient           Patient will benefit from skilled therapeutic intervention in order to improve the following deficits and impairments:  Abnormal gait, Pain, Decreased strength, Difficulty walking, Decreased range of motion, Decreased mobility, Decreased balance, Decreased activity tolerance, Increased edema  Visit Diagnosis: Acute pain of left knee - Plan: PT plan of care cert/re-cert  Stiffness of left knee, not elsewhere classified - Plan: PT plan of care cert/re-cert  Other abnormalities of gait and mobility - Plan: PT plan of care cert/re-cert  Localized edema - Plan: PT plan of care cert/re-cert  Muscle weakness (generalized) - Plan: PT plan of care cert/re-cert  Unsteadiness on feet - Plan: PT plan of care cert/re-cert     Problem List Patient Active Problem List   Diagnosis Date Noted  . Left anterior cruciate ligament tear 09/14/2020  . Acute medial meniscus tear of left knee 09/14/2020       Laureen Abrahams, PT, DPT 10/04/20 2:32 PM     [facility] [address] [place], [place], [address] Phone: [phone]   Fax:  [phone]  Name: Mazella Deen MRN: [id number] Date of Birth: 03/11/1991

## 2020-10-04 NOTE — Patient Instructions (Signed)
Access Code: [facility] URL: [email or web address] Date: 10/04/2020 Prepared by: Faustino Congress  Exercises Supine Quadricep Sets - 5-7 x daily - 7 x weekly - 3 sets - 10 reps Active Straight Leg Raise with Quad Set - 2-3 x daily - 7 x weekly - 2-3 sets - 10 reps Supine Heel Slide with Strap - 2-3 x daily - 7 x weekly - 2-3 sets - 10 reps Sidelying Hip Abduction - 2-3 x daily - 7 x weekly - 2-3 sets - 10 reps Prone Hip Extension - 2-3 x daily - 7 x weekly - 2-3 sets - 10 reps Sidelying Hip Adduction - 2-3 x daily - 7 x weekly - 2-3 sets - 10 reps

## 2020-10-11 DIAGNOSIS — M6281 Muscle weakness (generalized): Secondary | ICD-10-CM

## 2020-10-11 DIAGNOSIS — M25562 Pain in left knee: Secondary | ICD-10-CM

## 2020-10-11 DIAGNOSIS — R2681 Unsteadiness on feet: Secondary | ICD-10-CM

## 2020-10-11 DIAGNOSIS — R600 Localized edema: Secondary | ICD-10-CM | POA: Diagnosis not present

## 2020-10-11 DIAGNOSIS — M25662 Stiffness of left knee, not elsewhere classified: Secondary | ICD-10-CM

## 2020-10-11 DIAGNOSIS — R2689 Other abnormalities of gait and mobility: Secondary | ICD-10-CM | POA: Diagnosis not present

## 2020-10-11 NOTE — Therapy (Signed)
[facility] [place], [place], [address] Phone: [phone]   Fax:  [phone]  Physical Therapy Treatment  Patient Details  Name: Kelly Vasquez MRN: [id number] Date of Birth: 05-03-1991 Referring Provider (PT): Dr. Erlinda Hong   Encounter Date: 10/11/2020   PT End of Session - 10/11/20 1012    Visit Number 2    Number of Visits 16    Date for PT Re-Evaluation 11/29/20    Progress Note Due on Visit 10    PT Start Time 1013    PT Stop Time 1054    PT Time Calculation (min) 41 min    Activity Tolerance Patient tolerated treatment well    Behavior During Therapy [facility] for tasks assessed/performed           Past Medical History:  Diagnosis Date  . ACL tear     Past Surgical History:  Procedure Laterality Date  . KNEE ARTHROSCOPY WITH ANTERIOR CRUCIATE LIGAMENT (ACL) REPAIR WITH HAMSTRING GRAFT Left 09/24/2020   Procedure: LEFT KNEE ARTHROSCOPY WITH ANTERIOR CRUCIATE LIGAMENT (ACL) RECONSTRUCTION, PARTIAL MEDIAL MENISCUS REPAIR;  Surgeon: Leandrew Koyanagi, MD;  Location: [place];  Service: Orthopedics;  Laterality: Left;  . NO PAST SURGERIES      There were no vitals filed for this visit.   Subjective Assessment - 10/11/20 1018    Subjective Pt. indicated pain c bending knee, pain reported in back of knee.  Pt. stated buckling also happens.    Limitations Standing;Walking    Patient Stated Goals regain motion, back to work, dance at wedding in Nov, sleep on side    Currently in Pain? No/denies   no pain at rest   Pain Onset 1 to 4 weeks ago    Aggravating Factors  bending, buckling in walking/standing                             [facility] Adult PT Treatment/Exercise - 10/11/20 0001      Exercises   Exercises Knee/Hip;Other Exercises    Other Exercises  BFR Lt LE supine size 4, LOP 182 mmHg      Knee/Hip Exercises: Aerobic   Nustep Lvl 6 7 mins      Knee/Hip Exercises: Standing   Lateral Step Up  Left;Step Height: 4"   BFR 140 mmHg x 30, x 15, x15, x15,     Knee/Hip Exercises: Seated   Hamstring Curl Left;2 sets;15 reps   green band     Knee/Hip Exercises: Supine   Straight Leg Raises Left   BFR 140 mmHg x30, x12, x15, x15     Manual Therapy   Manual therapy comments Lt knee flexion MWM c distraction, mild IR while in supine                  PT Education - 10/11/20 1032    Education Details BFR education    Person(s) Educated Patient    Methods Explanation;Demonstration    Comprehension Returned demonstration;Verbalized understanding            PT Short Term Goals - 10/04/20 1428      PT SHORT TERM GOAL #1   Title demonstrate ability to perform SLR x 10 reps without extensor lag for improved strength to d/c immobilizer    Status New    Target Date 11/01/20             PT Long Term Goals - 10/04/20 1429  PT LONG TERM GOAL #1   Title independent with HEP    Status New    Target Date 11/29/20      PT LONG TERM GOAL #2   Title improve Lt knee AROM0-130 for improved function    Status New    Target Date 11/29/20      PT LONG TERM GOAL #3   Title amb without significant gait deviations for improved mobility    Status New    Target Date 11/29/20      PT LONG TERM GOAL #4   Title perform SL reach test for functional strength assessment    Status New    Target Date 11/29/20      PT LONG TERM GOAL #5   Title FOTO score improved to </= 40% limited for improved function    Status New    Target Date 11/29/20                 Plan - 10/11/20 1032    Clinical Impression Statement Good overall tolerance to BFR training intervention c normal fatigue noted c intervention. Pt. to benefit from continued skilled PT services to improve range of motion, strength and balance.    Personal Factors and Comorbidities Time since onset of injury/illness/exacerbation    Examination-Activity Limitations Sit;Bathing;Bed  Mobility;Sleep;Bend;Squat;Stairs;Caring for Others;Stand;Transfers;Lift;Locomotion Level    Examination-Participation Restrictions Cleaning;Community Activity;Driving;Occupation    Stability/Clinical Decision Making Stable/Uncomplicated    Rehab Potential Good    PT Frequency 3x / week   2-3x/wk   PT Duration 8 weeks    PT Treatment/Interventions ADLs/Self Care Home Management;Cryotherapy;Electrical Stimulation;Moist Heat;Ultrasound;Gait training;Stair training;Functional mobility training;Therapeutic activities;Therapeutic exercise;Balance training;Manual techniques;Patient/family education;Neuromuscular re-education;Scar mobilization;Passive range of motion;Taping;Vasopneumatic Device    PT Next Visit Plan BFR, static balance,    PT Home Exercise Plan Access Code: [email or web address]    Consulted and Agree with Plan of Care Patient           Patient will benefit from skilled therapeutic intervention in order to improve the following deficits and impairments:  Abnormal gait, Pain, Decreased strength, Difficulty walking, Decreased range of motion, Decreased mobility, Decreased balance, Decreased activity tolerance, Increased edema  Visit Diagnosis: Acute pain of left knee  Stiffness of left knee, not elsewhere classified  Other abnormalities of gait and mobility  Localized edema  Muscle weakness (generalized)  Unsteadiness on feet     Problem List Patient Active Problem List   Diagnosis Date Noted  . Left anterior cruciate ligament tear 09/14/2020  . Acute medial meniscus tear of left knee 09/14/2020    Scot Jun, PT, DPT, OCS, ATC 10/11/20  10:59 AM    [facility] [address] [place], [place], [address] Phone: [phone]   Fax:  [phone]  Name: Kelly Vasquez MRN: [id number] Date of Birth: 12/01/1991

## 2020-10-13 DIAGNOSIS — M25562 Pain in left knee: Secondary | ICD-10-CM

## 2020-10-13 DIAGNOSIS — M25662 Stiffness of left knee, not elsewhere classified: Secondary | ICD-10-CM

## 2020-10-13 DIAGNOSIS — M6281 Muscle weakness (generalized): Secondary | ICD-10-CM

## 2020-10-13 DIAGNOSIS — R600 Localized edema: Secondary | ICD-10-CM

## 2020-10-13 DIAGNOSIS — R2689 Other abnormalities of gait and mobility: Secondary | ICD-10-CM | POA: Diagnosis not present

## 2020-10-13 DIAGNOSIS — R2681 Unsteadiness on feet: Secondary | ICD-10-CM

## 2020-10-13 NOTE — Telephone Encounter (Signed)
Patient called requesting an updated out of work note. Please fax her employer updated work note and call her at [phone].

## 2020-10-13 NOTE — Telephone Encounter (Signed)
Note made.  

## 2020-10-13 NOTE — Telephone Encounter (Signed)
Out of work until follow up appointment

## 2020-10-13 NOTE — Therapy (Signed)
[place], [place], [address] Phone: [phone]   Fax:  [phone]  Physical Therapy Treatment  Patient Details  Name: Kelly Vasquez MRN: [id number] Date of Birth: 30-Jan-1991 Referring Provider (PT): Dr. Erlinda Hong   Encounter Date: 10/13/2020   PT End of Session - 10/13/20 1422    Visit Number 3    Number of Visits 16    Date for PT Re-Evaluation 11/29/20    Progress Note Due on Visit 10    PT Start Time 1426    Activity Tolerance Patient tolerated treatment well    Behavior During Therapy [facility] for tasks assessed/performed           Past Medical History:  Diagnosis Date  . ACL tear     Past Surgical History:  Procedure Laterality Date  . KNEE ARTHROSCOPY WITH ANTERIOR CRUCIATE LIGAMENT (ACL) REPAIR WITH HAMSTRING GRAFT Left 09/24/2020   Procedure: LEFT KNEE ARTHROSCOPY WITH ANTERIOR CRUCIATE LIGAMENT (ACL) RECONSTRUCTION, PARTIAL MEDIAL MENISCUS REPAIR;  Surgeon: Leandrew Koyanagi, MD;  Location: [place];  Service: Orthopedics;  Laterality: Left;  . NO PAST SURGERIES      There were no vitals filed for this visit.   Subjective Assessment - 10/13/20 1428    Subjective Pt. indicated feeling tired and muscle sore after last visit.  felt her knee bends a bit more now.    Limitations Standing;Walking    Patient Stated Goals regain motion, back to work, dance at wedding in Nov, sleep on side    Currently in Pain? No/denies    Pain Onset 1 to 4 weeks ago                             [facility] Adult PT Treatment/Exercise - 10/13/20 0001      Neuro Re-ed    Neuro Re-ed Details  lateral stepping 3 cones x 10 bilateral, rocker board side to side, fwd back balance 1 min each      Exercises   Other Exercises  BFR Lt LE supine size 4, LOP 182 mmHg      Knee/Hip Exercises: Aerobic   Recumbent Bike Lvl 2 10 mins full rev      Knee/Hip Exercises: Machines for Strengthening   Total Gym Leg Press  Rt 100 lbs x25, Lt c BFR 140 mmHg 37 lbs x 30, x15, x15, x15                    PT Short Term Goals - 10/04/20 1428      PT SHORT TERM GOAL #1   Title demonstrate ability to perform SLR x 10 reps without extensor lag for improved strength to d/c immobilizer    Status New    Target Date 11/01/20             PT Long Term Goals - 10/04/20 1429      PT LONG TERM GOAL #1   Title independent with HEP    Status New    Target Date 11/29/20      PT LONG TERM GOAL #2   Title improve Lt knee AROM0-130 for improved function    Status New    Target Date 11/29/20      PT LONG TERM GOAL #3   Title amb without significant gait deviations for improved mobility    Status New    Target Date 11/29/20      PT LONG  TERM GOAL #4   Title perform SL reach test for functional strength assessment    Status New    Target Date 11/29/20      PT LONG TERM GOAL #5   Title FOTO score improved to </= 40% limited for improved function    Status New    Target Date 11/29/20                 Plan - 10/13/20 1451    Clinical Impression Statement Additional guidances and cues on motor planning improvement for neuro re ed.   Otherwise, no negative reaction to intervention today. Continued skilled PT services indicated for impairments.    Personal Factors and Comorbidities Time since onset of injury/illness/exacerbation    Examination-Activity Limitations Sit;Bathing;Bed Mobility;Sleep;Bend;Squat;Stairs;Caring for Others;Stand;Transfers;Lift;Locomotion Level    Examination-Participation Restrictions Cleaning;Community Activity;Driving;Occupation    Stability/Clinical Decision Making Stable/Uncomplicated    Rehab Potential Good    PT Frequency 3x / week   2-3x/wk   PT Duration 8 weeks    PT Treatment/Interventions ADLs/Self Care Home Management;Cryotherapy;Electrical Stimulation;Moist Heat;Ultrasound;Gait training;Stair training;Functional mobility training;Therapeutic  activities;Therapeutic exercise;Balance training;Manual techniques;Patient/family education;Neuromuscular re-education;Scar mobilization;Passive range of motion;Taping;Vasopneumatic Device    PT Next Visit Plan BFR, static balance on compliant/non compliant surface    PT Home Exercise Plan Access Code: [email or web address]    Consulted and Agree with Plan of Care Patient           Patient will benefit from skilled therapeutic intervention in order to improve the following deficits and impairments:  Abnormal gait, Pain, Decreased strength, Difficulty walking, Decreased range of motion, Decreased mobility, Decreased balance, Decreased activity tolerance, Increased edema  Visit Diagnosis: Acute pain of left knee  Stiffness of left knee, not elsewhere classified  Other abnormalities of gait and mobility  Localized edema  Muscle weakness (generalized)  Unsteadiness on feet     Problem List Patient Active Problem List   Diagnosis Date Noted  . Left anterior cruciate ligament tear 09/14/2020  . Acute medial meniscus tear of left knee 09/14/2020   Scot Jun, PT, DPT, OCS, ATC 10/13/20  3:04 PM    [facility] [address] [place], [place], [address] Phone: [phone]   Fax:  [phone]  Name: Kelly Vasquez MRN: [id number] Date of Birth: 1991-09-18

## 2020-10-14 NOTE — Telephone Encounter (Signed)
Ic,lmvm advised [employer] mailed packet on 9/29 instructing of $25.00 form fee and needed auth. I reiterated those things are needed for forms to be completed. Advised can bring to our office or take to [employer],left their address

## 2020-10-14 NOTE — Telephone Encounter (Signed)
Pt states she believes she over did it @ PT as she is no longer able to get around her home and she was getting around just fine before PT and so she would like to know if she could get hydrocodone-tylenol sent in? Pt would like a CB with an update  [phone]

## 2020-10-15 NOTE — Telephone Encounter (Signed)
Sent her a mychart message to see if she wants me to fax somewhere. If so, we will need a fax #.

## 2020-10-15 NOTE — Telephone Encounter (Signed)
I called and lm on vm to advise that rx has been sent to pharm.  

## 2020-10-15 NOTE — Telephone Encounter (Signed)
Sent in

## 2020-10-18 DIAGNOSIS — R2681 Unsteadiness on feet: Secondary | ICD-10-CM

## 2020-10-18 DIAGNOSIS — R2689 Other abnormalities of gait and mobility: Secondary | ICD-10-CM | POA: Diagnosis not present

## 2020-10-18 DIAGNOSIS — M25562 Pain in left knee: Secondary | ICD-10-CM

## 2020-10-18 DIAGNOSIS — M6281 Muscle weakness (generalized): Secondary | ICD-10-CM | POA: Diagnosis not present

## 2020-10-18 DIAGNOSIS — M25662 Stiffness of left knee, not elsewhere classified: Secondary | ICD-10-CM | POA: Diagnosis not present

## 2020-10-18 DIAGNOSIS — R600 Localized edema: Secondary | ICD-10-CM

## 2020-10-18 NOTE — Therapy (Signed)
[place], [place], [address] Phone: [phone]   Fax:  [phone]  Physical Therapy Treatment  Patient Details  Name: Kelly Vasquez MRN: [id number] Date of Birth: [birth year] Referring Provider (PT): Dr. Erlinda Hong   Encounter Date: 10/18/2020   PT End of Session - 10/18/20 1118    Visit Number 4    Number of Visits 16    Date for PT Re-Evaluation 11/29/20    Progress Note Due on Visit 10    PT Start Time 0930    PT Stop Time 1012    PT Time Calculation (min) 42 min    Activity Tolerance Patient tolerated treatment well    Behavior During Therapy [facility] for tasks assessed/performed           Past Medical History:  Diagnosis Date  . ACL tear     Past Surgical History:  Procedure Laterality Date  . KNEE ARTHROSCOPY WITH ANTERIOR CRUCIATE LIGAMENT (ACL) REPAIR WITH HAMSTRING GRAFT Left 09/24/2020   Procedure: LEFT KNEE ARTHROSCOPY WITH ANTERIOR CRUCIATE LIGAMENT (ACL) RECONSTRUCTION, PARTIAL MEDIAL MENISCUS REPAIR;  Surgeon: Leandrew Koyanagi, MD;  Location: [place];  Service: Orthopedics;  Laterality: Left;  . NO PAST SURGERIES      There were no vitals filed for this visit.   Subjective Assessment - 10/18/20 0951    Subjective Pt arriving reporting numbness in L knee and mild swelling noted. Pt arriving with no brace on today. 5/10 pain in left knee reported    Limitations Standing;Walking    Patient Stated Goals regain motion, back to work, dance at wedding in Nov, sleep on side    Currently in Pain? Yes    Pain Score 5     Pain Location Knee    Pain Orientation Left    Pain Descriptors / Indicators Aching;Numbness    Pain Type Acute pain;Surgical pain    Pain Onset 1 to 4 weeks ago                             [facility] Adult PT Treatment/Exercise - 10/18/20 0001      Exercises   Other Exercises  BFR Lt LE supine size 4, LOP 186 mmHg      Knee/Hip Exercises: Stretches   Active  Hamstring Stretch Left;3 reps;30 seconds      Knee/Hip Exercises: Aerobic   Recumbent Bike Lvl 3 10 mins full rev      Knee/Hip Exercises: Machines for Strengthening   Total Gym Leg Press R LE BFR 37# 15x3, cuff at 160mmHg      Knee/Hip Exercises: Standing   Lateral Step Up Left;20 reps;Hand Hold: 0;Step Height: 6"    Other Standing Knee Exercises standing mini squats with BFR 15 x 3 cuff at 145 mmHg      Knee/Hip Exercises: Supine   Straight Leg Raises Strengthening;Left;3 sets;15 reps;Limitations;Other (comment)    Straight Leg Raises Limitations BFR cuff at 185mmHg                    PT Short Term Goals - 10/18/20 1113      PT SHORT TERM GOAL #1   Title demonstrate ability to perform SLR x 10 reps without extensor lag for improved strength to d/c immobilizer    Status Achieved             PT Long Term Goals - 10/18/20 1113  PT LONG TERM GOAL #1   Title independent with HEP    Status On-going      PT LONG TERM GOAL #2   Title improve Lt knee AROM0-130 for improved function    Status On-going      PT LONG TERM GOAL #3   Title amb without significant gait deviations for improved mobility    Status On-going      PT LONG TERM GOAL #4   Title perform SL reach test for functional strength assessment    Status On-going      PT LONG TERM GOAL #5   Title FOTO score improved to </= 40% limited for improved function    Status On-going                 Plan - 10/18/20 1012    Clinical Impression Statement Pt arriving to therapy reporting 5/10. Pt able to perform SLR with no extensor lag noted. Pt was instructed she could d/c her extensor brace and continue to be cautious if walking on uneven surfaces until further mobiliy and strengthening occur. Pt tolerating BFR therapy well and making progress with strengthening. Pt instructed to add SLR and mini squats to her HEP. Continue skilled PT.    Personal Factors and Comorbidities Time since onset of  injury/illness/exacerbation    Examination-Activity Limitations Sit;Bathing;Bed Mobility;Sleep;Bend;Squat;Stairs;Caring for Others;Stand;Transfers;Lift;Locomotion Level    Examination-Participation Restrictions Cleaning;Community Activity;Driving;Occupation    Stability/Clinical Decision Making Stable/Uncomplicated    Rehab Potential Good    PT Frequency 3x / week    PT Duration 8 weeks    PT Treatment/Interventions ADLs/Self Care Home Management;Cryotherapy;Electrical Stimulation;Moist Heat;Ultrasound;Gait training;Stair training;Functional mobility training;Therapeutic activities;Therapeutic exercise;Balance training;Manual techniques;Patient/family education;Neuromuscular re-education;Scar mobilization;Passive range of motion;Taping;Vasopneumatic Device    PT Next Visit Plan BFR, static balance on compliant/non compliant surface    PT Home Exercise Plan Access Code: [email or web address]    Consulted and Agree with Plan of Care Patient           Patient will benefit from skilled therapeutic intervention in order to improve the following deficits and impairments:  Abnormal gait, Pain, Decreased strength, Difficulty walking, Decreased range of motion, Decreased mobility, Decreased balance, Decreased activity tolerance, Increased edema  Visit Diagnosis: Acute pain of left knee  Stiffness of left knee, not elsewhere classified  Other abnormalities of gait and mobility  Muscle weakness (generalized)  Localized edema  Unsteadiness on feet     Problem List Patient Active Problem List   Diagnosis Date Noted  . Left anterior cruciate ligament tear 09/14/2020  . Acute medial meniscus tear of left knee 09/14/2020    Kelly Vasquez , PT, MPT 10/18/2020, 11:19 AM  [facility] [address] [place], [place], [address] Phone: [phone]   Fax:  [phone]  Name: Kelly Vasquez MRN: [id number] Date of Birth: 25-Sep-1991

## 2020-10-28 DIAGNOSIS — S83512A Sprain of anterior cruciate ligament of left knee, initial encounter: Secondary | ICD-10-CM | POA: Diagnosis not present

## 2020-10-28 DIAGNOSIS — M25562 Pain in left knee: Secondary | ICD-10-CM

## 2020-10-28 DIAGNOSIS — S83242A Other tear of medial meniscus, current injury, left knee, initial encounter: Secondary | ICD-10-CM

## 2020-10-28 NOTE — Progress Notes (Signed)
Office Visit Note   Patient: Kelly Vasquez           Date of Birth: 1991/05/20           MRN: [id number] Visit Date: 10/28/2020              Requested by: Avel Sensor, MD [address]   [address] PCP: Avel Sensor, MD   Assessment & Plan: Visit Diagnoses:  1. Acute pain of left knee   2. Rupture of anterior cruciate ligament of left knee, initial encounter   3. Acute medial meniscus tear of left knee, initial encounter     Plan: Impression is acute left knee effusion and crepitus.  Graft feels stable and I am not getting a sense that the meniscal repair is compromised.  I think her symptoms are mainly stemming from the effusion.  Aspiration injection performed today.  She will resume physical therapy once she feels ready to do so.  I recommend that she follow-up with [place] in a couple weeks for recheck.  Fluid was nonbloody and looked inflammatory.  20 cc of fluid was obtained.  Follow-Up Instructions: Return in about 2 weeks (around 11/11/2020).   Orders:  Orders Placed This Encounter  Procedures  . Large Joint Inj  . XR Knee 1-2 Views Left   Meds ordered this encounter  Medications  . HYDROcodone-acetaminophen (NORCO) 5-325 MG tablet    Sig: Take 1-2 tablets by mouth 2 (two) times daily as needed.    Dispense:  30 tablet    Refill:  0      Procedures: Large Joint Inj: L knee on 10/28/2020 9:00 AM Details: 22 G needle Medications: 2 mL bupivacaine 0.5 %; 2 mL lidocaine 1 %; 40 mg methylPREDNISolone acetate 40 MG/ML Outcome: tolerated well, no immediate complications Patient was prepped and draped in the usual sterile fashion.       Clinical Data: No additional findings.   Subjective: Chief Complaint  Patient presents with  . Left Knee - Follow-up    Left knee ACL reconstruction 09/24/2020    Kelly Vasquez is 1 month status post left ACL reconstruction with quadriceps graft and bucket-handle medial meniscal repair and debridement of  lateral meniscus tear.  She had been progressing well with physical therapy but over the weekend she had acute onset of swelling and pain throughout the knee.  She is also noticed a grinding and creaking noise with range of motion of the knee.  She denies any mechanical symptoms or any trauma or recent injuries.  Denies any constitutional symptoms.  She has been wearing a hinged knee brace.   Review of Systems   Objective: Vital Signs: Ht 5\' 7"  (1.702 m)   Wt 165 lb (74.8 kg)   BMI 25.84 kg/m   Physical Exam  Ortho Exam Left knee shows fully healed surgical scars.  Her range of motion is moderately limited due to the effusion.  She has a moderately sized joint effusion.  ACL graft feels stable.  Collaterals feel stable.  She has slight medial joint line tenderness.  No lateral joint line tenderness. Specialty Comments:  No specialty comments available.  Imaging: XR Knee 1-2 Views Left  Result Date: 10/28/2020 Stable Endobutton on femoral side and stable interference screw on the tibial side.  No acute abnormalities.    PMFS History: Patient Active Problem List   Diagnosis Date Noted  . Left anterior cruciate ligament tear 09/14/2020  . Acute medial meniscus tear  of left knee 09/14/2020   Past Medical History:  Diagnosis Date  . ACL tear     History reviewed. No pertinent family history.  Past Surgical History:  Procedure Laterality Date  . KNEE ARTHROSCOPY WITH ANTERIOR CRUCIATE LIGAMENT (ACL) REPAIR WITH HAMSTRING GRAFT Left 09/24/2020   Procedure: LEFT KNEE ARTHROSCOPY WITH ANTERIOR CRUCIATE LIGAMENT (ACL) RECONSTRUCTION, PARTIAL MEDIAL MENISCUS REPAIR;  Surgeon: Leandrew Koyanagi, MD;  Location: [place];  Service: Orthopedics;  Laterality: Left;  . NO PAST SURGERIES     Social History   Occupational History  . Not on file  Tobacco Use  . Smoking status: Never Smoker  . Smokeless tobacco: Never Used  Vaping Use  . Vaping Use: Never used  Substance  and Sexual Activity  . Alcohol use: Yes    Comment: occas  . Drug use: Never  . Sexual activity: Not on file

## 2020-11-08 DIAGNOSIS — M25562 Pain in left knee: Secondary | ICD-10-CM | POA: Diagnosis not present

## 2020-11-08 DIAGNOSIS — R2681 Unsteadiness on feet: Secondary | ICD-10-CM

## 2020-11-08 DIAGNOSIS — M25662 Stiffness of left knee, not elsewhere classified: Secondary | ICD-10-CM | POA: Diagnosis not present

## 2020-11-08 DIAGNOSIS — R600 Localized edema: Secondary | ICD-10-CM | POA: Diagnosis not present

## 2020-11-08 DIAGNOSIS — R2689 Other abnormalities of gait and mobility: Secondary | ICD-10-CM

## 2020-11-08 DIAGNOSIS — M6281 Muscle weakness (generalized): Secondary | ICD-10-CM

## 2020-11-08 NOTE — Therapy (Signed)
[facility] Physical Therapy [address] [place], [place], [address] Phone: [phone]   Fax:  [phone]  Physical Therapy Treatment  Patient Details  Name: Kelly Vasquez MRN: [id number] Date of Birth: 10-Sep-1991 Referring Provider (PT): Dr. Erlinda Hong   Encounter Date: 11/08/2020   PT End of Session - 11/08/20 0925    Visit Number 5    Number of Visits 16    Date for PT Re-Evaluation 11/29/20    Progress Note Due on Visit 10    PT Start Time 0845    PT Stop Time 0925    PT Time Calculation (min) 40 min    Activity Tolerance Patient tolerated treatment well    Behavior During Therapy [facility] for tasks assessed/performed           Past Medical History:  Diagnosis Date   ACL tear     Past Surgical History:  Procedure Laterality Date   KNEE ARTHROSCOPY WITH ANTERIOR CRUCIATE LIGAMENT (ACL) REPAIR WITH HAMSTRING GRAFT Left 09/24/2020   Procedure: LEFT KNEE ARTHROSCOPY WITH ANTERIOR CRUCIATE LIGAMENT (ACL) RECONSTRUCTION, PARTIAL MEDIAL MENISCUS REPAIR;  Surgeon: Leandrew Koyanagi, MD;  Location: [place];  Service: Orthopedics;  Laterality: Left;   NO PAST SURGERIES      There were no vitals filed for this visit.   Subjective Assessment - 11/08/20 0847    Subjective doing much better after having fluid drained from knee    Limitations Standing;Walking    Patient Stated Goals regain motion, back to work, dance at wedding in Nov, sleep on side    Currently in Pain? No/denies              [facility] PT Assessment - 11/08/20 0854      Assessment   Medical Diagnosis Lt ACL repair, medial meniscus repair    Referring Provider (PT) Dr. Erlinda Hong    Onset Date/Surgical Date 09/24/20      AROM   Left Knee Extension 2    Left Knee Flexion 131                         [facility] Adult PT Treatment/Exercise - 11/08/20 0849      Knee/Hip Exercises: Stretches   Other Knee/Hip Stretches seated childs pose 10x10 sec      Knee/Hip Exercises:  Aerobic   Recumbent Bike Lvl 3 10 mins full rev      Knee/Hip Exercises: Machines for Strengthening   Total Gym Leg Press Lt 50# 3x10      Knee/Hip Exercises: Seated   Long Arc Quad Left;3 sets;10 reps;Weights    Long Arc Quad Weight 5 lbs.    Long [employer] Limitations cues to decrease hip internal rotation                    PT Short Term Goals - 10/18/20 1113      PT SHORT TERM GOAL #1   Title demonstrate ability to perform SLR x 10 reps without extensor lag for improved strength to d/c immobilizer    Status Achieved             PT Long Term Goals - 10/18/20 1113      PT LONG TERM GOAL #1   Title independent with HEP    Status On-going      PT LONG TERM GOAL #2   Title improve Lt knee AROM0-130 for improved function    Status On-going  PT LONG TERM GOAL #3   Title amb without significant gait deviations for improved mobility    Status On-going      PT LONG TERM GOAL #4   Title perform SL reach test for functional strength assessment    Status On-going      PT LONG TERM GOAL #5   Title FOTO score improved to </= 40% limited for improved function    Status On-going                 Plan - 11/08/20 0925    Clinical Impression Statement Pt has not been to PT x 3 weeks due to increased effusion and pain, now resolved following aspiration of Lt knee.  Overall AROM near equal to RLE and tolerated session well today.  Plan to reintroduce BFR next visit.    Personal Factors and Comorbidities Time since onset of injury/illness/exacerbation    Examination-Activity Limitations Sit;Bathing;Bed Mobility;Sleep;Bend;Squat;Stairs;Caring for Others;Stand;Transfers;Lift;Locomotion Level    Examination-Participation Restrictions Cleaning;Community Activity;Driving;Occupation    Stability/Clinical Decision Making Stable/Uncomplicated    Rehab Potential Good    PT Frequency 3x / week    PT Duration 8 weeks    PT Treatment/Interventions ADLs/Self Care Home  Management;Cryotherapy;Electrical Stimulation;Moist Heat;Ultrasound;Gait training;Stair training;Functional mobility training;Therapeutic activities;Therapeutic exercise;Balance training;Manual techniques;Patient/family education;Neuromuscular re-education;Scar mobilization;Passive range of motion;Taping;Vasopneumatic Device    PT Next Visit Plan BFR, static balance on compliant/non compliant surface    PT Home Exercise Plan Access Code: [email or web address]    Consulted and Agree with Plan of Care Patient           Patient will benefit from skilled therapeutic intervention in order to improve the following deficits and impairments:  Abnormal gait, Pain, Decreased strength, Difficulty walking, Decreased range of motion, Decreased mobility, Decreased balance, Decreased activity tolerance, Increased edema  Visit Diagnosis: Acute pain of left knee  Stiffness of left knee, not elsewhere classified  Other abnormalities of gait and mobility  Localized edema  Muscle weakness (generalized)  Unsteadiness on feet     Problem List Patient Active Problem List   Diagnosis Date Noted   Left anterior cruciate ligament tear 09/14/2020   Acute medial meniscus tear of left knee 09/14/2020      Laureen Abrahams, PT, DPT 11/08/20 9:27 AM    [facility] Physical Therapy [address] [place], [place], [address] Phone: [phone]   Fax:  [phone]  Name: Shadana Pry MRN: [id number] Date of Birth: Jul 13, 1991

## 2020-11-10 DIAGNOSIS — R600 Localized edema: Secondary | ICD-10-CM

## 2020-11-10 DIAGNOSIS — R2689 Other abnormalities of gait and mobility: Secondary | ICD-10-CM

## 2020-11-10 DIAGNOSIS — M25562 Pain in left knee: Secondary | ICD-10-CM | POA: Diagnosis not present

## 2020-11-10 DIAGNOSIS — R2681 Unsteadiness on feet: Secondary | ICD-10-CM

## 2020-11-10 DIAGNOSIS — M6281 Muscle weakness (generalized): Secondary | ICD-10-CM

## 2020-11-10 DIAGNOSIS — M25662 Stiffness of left knee, not elsewhere classified: Secondary | ICD-10-CM

## 2020-11-10 NOTE — Therapy (Addendum)
[place], [place], [address] Phone: [phone]   Fax:  [phone]  Physical Therapy Treatment  Patient Details  Name: Kelly Vasquez MRN: [id number] Date of Birth: 1991-10-13 Referring Provider (PT): Dr. Erlinda Hong   Encounter Date: 11/10/2020   PT End of Session - 11/10/20 1018    Visit Number 6    Number of Visits 16    Date for PT Re-Evaluation 11/29/20    Progress Note Due on Visit 10    PT Start Time [phone]   increased time due to medical issue   PT Stop Time 0938    PT Time Calculation (min) 57 min    Activity Tolerance Patient tolerated treatment well;Treatment limited secondary to medical complications (Comment)    Behavior During Therapy [facility] for tasks assessed/performed           Past Medical History:  Diagnosis Date  . ACL tear     Past Surgical History:  Procedure Laterality Date  . KNEE ARTHROSCOPY WITH ANTERIOR CRUCIATE LIGAMENT (ACL) REPAIR WITH HAMSTRING GRAFT Left 09/24/2020   Procedure: LEFT KNEE ARTHROSCOPY WITH ANTERIOR CRUCIATE LIGAMENT (ACL) RECONSTRUCTION, PARTIAL MEDIAL MENISCUS REPAIR;  Surgeon: Leandrew Koyanagi, MD;  Location: [place];  Service: Orthopedics;  Laterality: Left;  . NO PAST SURGERIES      There were no vitals filed for this visit.   Subjective Assessment - 11/10/20 0844    Subjective knee feels great today, felt better after last session    Limitations Standing;Walking    Patient Stated Goals regain motion, back to work, dance at wedding in Nov, sleep on side    Currently in Pain? No/denies                             [facility] Adult PT Treatment/Exercise - 11/10/20 0847      Exercises   Other Exercises  BFR Lt LE supine size 4, LOP 190 mmHg; exercises performed at 150 mmHg      Knee/Hip Exercises: Aerobic   Recumbent Bike Lvl 5 10 mins full rev      Knee/Hip Exercises: Standing   Functional Squat Limitations 3x10 with BFR      Knee/Hip  Exercises: Seated   Long Arc Quad Left   30, 15, 15 15 reps with 30 sec rest, BFR   Long Arc Quad Limitations pressure decreased to 140 mmHg halfway through 2nd set due to ligthheadedness                    PT Short Term Goals - 10/18/20 1113      PT SHORT TERM GOAL #1   Title demonstrate ability to perform SLR x 10 reps without extensor lag for improved strength to d/c immobilizer    Status Achieved             PT Long Term Goals - 10/18/20 1113      PT LONG TERM GOAL #1   Title independent with HEP    Status On-going      PT LONG TERM GOAL #2   Title improve Lt knee AROM0-130 for improved function    Status On-going      PT LONG TERM GOAL #3   Title amb without significant gait deviations for improved mobility    Status On-going      PT LONG TERM GOAL #4   Title perform SL reach test for functional strength assessment  Status On-going      PT LONG TERM GOAL #5   Title FOTO score improved to </= 40% limited for improved function    Status On-going                 Plan - 11/10/20 1018    Clinical Impression Statement Initiated BFR again today as her knee continued to do really well following PT last session.  Pt with lightheadedness intially with seated LAQs which resolved quickly with supine rest.  Continued with BFR for standing squats 3x10 which pt tolerated well, then upon removing cuff became lightheaded and with decreased responsiveness while sitting in chair.  Assisted pt to supine position with feet elevated, and encouraged ankle pumps.  Provided animal crackers and orange juice and with ~ 10 min rest pt back to baseline alertness.  Assisted pt out of gym without difficulty and she reported feeling back to baseline.  She plans to call OB/Gyn (this is also PCP) for appt to follow up on today's event.  PT will hold on BFR for now, and continue to progress per protocol as able.    Personal Factors and Comorbidities Time since onset of  injury/illness/exacerbation    Examination-Activity Limitations Sit;Bathing;Bed Mobility;Sleep;Bend;Squat;Stairs;Caring for Others;Stand;Transfers;Lift;Locomotion Level    Examination-Participation Restrictions Cleaning;Community Activity;Driving;Occupation    Stability/Clinical Decision Making Stable/Uncomplicated    Rehab Potential Good    PT Frequency 3x / week    PT Duration 8 weeks    PT Treatment/Interventions ADLs/Self Care Home Management;Cryotherapy;Electrical Stimulation;Moist Heat;Ultrasound;Gait training;Stair training;Functional mobility training;Therapeutic activities;Therapeutic exercise;Balance training;Manual techniques;Patient/family education;Neuromuscular re-education;Scar mobilization;Passive range of motion;Taping;Vasopneumatic Device    PT Next Visit Plan static balance on compliant/non compliant surface, continue with quad strengthening - no BFR due to syncopal episode on 11/10/20    PT Home Exercise Plan Access Code: [facility]    Consulted and Agree with Plan of Care Patient           Patient will benefit from skilled therapeutic intervention in order to improve the following deficits and impairments:  Abnormal gait, Pain, Decreased strength, Difficulty walking, Decreased range of motion, Decreased mobility, Decreased balance, Decreased activity tolerance, Increased edema  Visit Diagnosis: Acute pain of left knee  Stiffness of left knee, not elsewhere classified  Other abnormalities of gait and mobility  Localized edema  Muscle weakness (generalized)  Unsteadiness on feet    PHYSICAL THERAPY DISCHARGE SUMMARY  Visits from Start of Care: 6/16  Current functional level related to goals / functional outcomes: See above    Remaining deficits: See above   Education / Equipment: HEP Plan: Patient agrees to discharge.  Patient goals were not met. Patient is being discharged due to not returning since the last visit.  ?????        Problem  List Patient Active Problem List   Diagnosis Date Noted  . Left anterior cruciate ligament tear 09/14/2020  . Acute medial meniscus tear of left knee 09/14/2020      Laureen Abrahams, PT, DPT 11/10/20 10:23 AM   Kearney Hard, PT, MPT 12/07/20 7:58 AM    [facility] Physical Therapy [address] [place], [place], [address] Phone: [phone]   Fax:  [phone]  Name: Kloe Oates MRN: [id number] Date of Birth: [birth year]

## 2020-11-25 DIAGNOSIS — G8929 Other chronic pain: Secondary | ICD-10-CM

## 2020-11-25 DIAGNOSIS — S83512A Sprain of anterior cruciate ligament of left knee, initial encounter: Secondary | ICD-10-CM

## 2020-11-25 DIAGNOSIS — M25562 Pain in left knee: Secondary | ICD-10-CM

## 2020-11-25 NOTE — Telephone Encounter (Signed)
She's allergic to Tramadol But she said that she just talked to [facility] and he said he would refill her Hydrocodone?

## 2020-11-25 NOTE — Telephone Encounter (Signed)
We need to wean to tramadol if she does not have a history of seizures.  Can you call this in?  50mg  1 tab tid prn pain #30

## 2020-11-25 NOTE — Telephone Encounter (Signed)
Ok, thanks.

## 2020-11-25 NOTE — Progress Notes (Signed)
   Post-Op Visit Note   Patient: Kelly Vasquez           Date of Birth: 06-05-1991           MRN: [id number] Visit Date: 11/25/2020 PCP: Avel Sensor, MD   Assessment & Plan:  Chief Complaint:  Chief Complaint  Patient presents with  . Left Knee - Pain   Visit Diagnoses:  1. Acute pain of left knee   2. Rupture of anterior cruciate ligament of left knee, initial encounter   3. Chronic pain of left knee     Plan:   Romeka is status post left ACL reconstruction with medial meniscal repair.  I saw her about a month ago and aspirated 20 cc of fluid.  This did briefly help with the symptoms.  The audible squeaking has returned.  She feels that the swelling has not been as bad this time.  Left knee shows fully healed surgical scars.  There is audible squeaking with range of motion of the knee which is painful to her.  Her graft feels stable.  No evidence of infection.  At this point with the continued squeaking we need an MRI to rule out problems with meniscal repair.  Follow-up after the MRI.  Work note provided today.  Hydrocodone refilled today.  Follow-Up Instructions: Return if symptoms worsen or fail to improve.   Orders:  Orders Placed This Encounter  Procedures  . MR Knee Left w/o contrast   Meds ordered this encounter  Medications  . HYDROcodone-acetaminophen (NORCO) 5-325 MG tablet    Sig: Take 1 tablet by mouth daily as needed.    Dispense:  20 tablet    Refill:  0    Imaging: No results found.  PMFS History: Patient Active Problem List   Diagnosis Date Noted  . Left anterior cruciate ligament tear 09/14/2020  . Acute medial meniscus tear of left knee 09/14/2020   Past Medical History:  Diagnosis Date  . ACL tear     History reviewed. No pertinent family history.  Past Surgical History:  Procedure Laterality Date  . KNEE ARTHROSCOPY WITH ANTERIOR CRUCIATE LIGAMENT (ACL) REPAIR WITH HAMSTRING GRAFT Left 09/24/2020   Procedure: LEFT KNEE ARTHROSCOPY  WITH ANTERIOR CRUCIATE LIGAMENT (ACL) RECONSTRUCTION, PARTIAL MEDIAL MENISCUS REPAIR;  Surgeon: Leandrew Koyanagi, MD;  Location: [place];  Service: Orthopedics;  Laterality: Left;  . NO PAST SURGERIES     Social History   Occupational History  . Not on file  Tobacco Use  . Smoking status: Never Smoker  . Smokeless tobacco: Never Used  Vaping Use  . Vaping Use: Never used  Substance and Sexual Activity  . Alcohol use: Yes    Comment: occas  . Drug use: Never  . Sexual activity: Not on file

## 2020-11-26 MED FILL — FLUARIX QUADRIVALENT 0.5 ML: 0.5 | 1 days supply | Qty: 1 | Fill #0

## 2020-11-27 DIAGNOSIS — J019 Acute sinusitis, unspecified: Secondary | ICD-10-CM

## 2020-11-27 DIAGNOSIS — Z1152 Encounter for screening for COVID-19: Secondary | ICD-10-CM

## 2020-11-27 DIAGNOSIS — R519 Headache, unspecified: Secondary | ICD-10-CM

## 2020-11-27 DIAGNOSIS — R059 Cough, unspecified: Secondary | ICD-10-CM

## 2020-11-27 NOTE — ED Triage Notes (Signed)
Patient states she has had a cough and nasal congestion and drainage x 2 weeks. Pt states she coughed up blood last night from coughing so much. Pt states no fever bur headache for 2 weeks. Pt is aox4 and ambulatory.

## 2020-11-27 NOTE — ED Provider Notes (Signed)
[place]   MRN: [id number] DOB: 1991-06-01  Subjective:   Kelly Vasquez is a 28 y.o. female presenting for 2-week history of persistent and worsening sinus congestion, throat pain, coughing.  She is now having facial pain, chest pain.  She noticed a little bit of blood in her cough as well.  She has had one sick contact, her stepson who is now better.  Has had COVID vaccination.  Denies history of asthma, allergies, smoking.  Has tried DayQuil, NyQuil, Mucinex without any relief.  No current facility-administered medications for this encounter.  Current Outpatient Medications:  .  HYDROcodone-acetaminophen (NORCO) 5-325 MG tablet, Take 1 tablet by mouth daily as needed., Disp: 20 tablet, Rfl: 0 .  HYDROcodone-acetaminophen (NORCO) 5-325 MG tablet, Take 1-2 tablets by mouth 2 (two) times daily as needed., Disp: 30 tablet, Rfl: 0 .  ibuprofen (ADVIL) 800 MG tablet, Take 1 tablet (800 mg total) by mouth every 8 (eight) hours as needed., Disp: 30 tablet, Rfl: 2   Allergies  Allergen Reactions  . Tramadol Itching    Past Medical History:  Diagnosis Date  . ACL tear      Past Surgical History:  Procedure Laterality Date  . KNEE ARTHROSCOPY WITH ANTERIOR CRUCIATE LIGAMENT (ACL) REPAIR WITH HAMSTRING GRAFT Left 09/24/2020   Procedure: LEFT KNEE ARTHROSCOPY WITH ANTERIOR CRUCIATE LIGAMENT (ACL) RECONSTRUCTION, PARTIAL MEDIAL MENISCUS REPAIR;  Surgeon: Leandrew Koyanagi, MD;  Location: [place];  Service: Orthopedics;  Laterality: Left;  . NO PAST SURGERIES      History reviewed. No pertinent family history.  Social History   Tobacco Use  . Smoking status: Never Smoker  . Smokeless tobacco: Never Used  Vaping Use  . Vaping Use: Never used  Substance Use Topics  . Alcohol use: Yes    Comment: occas  . Drug use: Never    ROS   Objective:   Vitals: BP 137/85 (BP Location: Left Arm)   Pulse (!) 112   Temp 98.7 F (37.1 C) (Oral)   Resp 20   LMP  11/03/2020 (Exact Date)   SpO2 98%   Physical Exam Constitutional:      General: She is not in acute distress.    Appearance: Normal appearance. She is well-developed. She is not ill-appearing, toxic-appearing or diaphoretic.  HENT:     Head: Normocephalic and atraumatic.     Nose: Nose normal.     Mouth/Throat:     Mouth: Mucous membranes are moist.  Eyes:     Extraocular Movements: Extraocular movements intact.     Pupils: Pupils are equal, round, and reactive to light.  Cardiovascular:     Rate and Rhythm: Normal rate and regular rhythm.     Pulses: Normal pulses.     Heart sounds: Normal heart sounds. No murmur heard.  No friction rub. No gallop.   Pulmonary:     Effort: Pulmonary effort is normal. No respiratory distress.     Breath sounds: Normal breath sounds. No stridor. No wheezing, rhonchi or rales.  Skin:    General: Skin is warm and dry.     Findings: No rash.  Neurological:     Mental Status: She is alert and oriented to person, place, and time.  Psychiatric:        Mood and Affect: Mood normal.        Behavior: Behavior normal.        Thought Content: Thought content normal.     Assessment and Plan :  I have reviewed the PDMP during this encounter.  1. Acute non-recurrent sinusitis, unspecified location   2. Cough   3. Encounter for screening for COVID-19   4. Facial pain     Will start empiric treatment for sinusitis with amoxicillin.  Recommended supportive care otherwise including the use of oral antihistamine, decongestant, cough suppression medications.  COVID-19 testing pending.  Counseled patient on potential for adverse effects with medications prescribed/recommended today, ER and return-to-clinic precautions discussed, patient verbalized understanding.    Jaynee Eagles, PA-C 11/27/20 1028

## 2020-11-28 DIAGNOSIS — S83512A Sprain of anterior cruciate ligament of left knee, initial encounter: Secondary | ICD-10-CM

## 2020-11-28 DIAGNOSIS — M25462 Effusion, left knee: Secondary | ICD-10-CM

## 2020-11-28 DIAGNOSIS — G8929 Other chronic pain: Secondary | ICD-10-CM

## 2020-11-28 DIAGNOSIS — M25562 Pain in left knee: Secondary | ICD-10-CM

## 2020-12-02 DIAGNOSIS — S83512A Sprain of anterior cruciate ligament of left knee, initial encounter: Secondary | ICD-10-CM | POA: Diagnosis not present

## 2020-12-02 DIAGNOSIS — S83242A Other tear of medial meniscus, current injury, left knee, initial encounter: Secondary | ICD-10-CM

## 2020-12-02 NOTE — Progress Notes (Signed)
Office Visit Note   Patient: Kelly Vasquez           Date of Birth: [birth year]           MRN: [id number] Visit Date: 12/02/2020              Requested by: Avel Sensor, MD [address]  [place] [address] PCP: Avel Sensor, MD   Assessment & Plan: Visit Diagnoses:  1. Rupture of anterior cruciate ligament of left knee, initial encounter   2. Acute medial meniscus tear of left knee, initial encounter     Plan: MRI is negative for meniscal pathology.  She does have edema in the fat pad.  ACL graft is stable.  No acute abnormalities.  She does have a large joint effusion.  I think the squeaking is related to the meniscal repair but I cannot identify anything pathologic on the MRI.  I think the majority of her symptoms are coming from the effusion and the inflammation.  We repeated joint aspiration and cortisone injection today.  55 cc of joint fluid aspirated today and cortisone injected.  I will put her on a course of meloxicam as well.  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   Orders:  No orders of the defined types were placed in this encounter.  Meds ordered this encounter  Medications  . meloxicam (MOBIC) 7.5 MG tablet    Sig: Take 2 tablets (15 mg total) by mouth daily.    Dispense:  30 tablet    Refill:  2  . HYDROcodone-acetaminophen (NORCO) 5-325 MG tablet    Sig: Take 1 tablet by mouth daily as needed.    Dispense:  20 tablet    Refill:  0      Procedures: Large Joint Inj: L knee on 12/02/2020 6:55 PM Details: 22 G needle Medications: 2 mL bupivacaine 0.5 %; 2 mL lidocaine 1 %; 40 mg methylPREDNISolone acetate 40 MG/ML Outcome: tolerated well, no immediate complications Patient  was prepped and draped in the usual sterile fashion.       Clinical Data: No additional findings.   Subjective: Chief Complaint  Patient presents with  . Left Knee - Pain    Kelly Vasquez returns today for MRI review.  She has swelling and discomfort in the left knee.   Review of Systems   Objective: Vital Signs: LMP 11/03/2020 (Exact Date)   Physical Exam  Ortho Exam Left knee shows a large joint effusion.  Fully healed surgical scars. Specialty Comments:  No specialty comments available.  Imaging: No results found.   PMFS History: Patient Active Problem List   Diagnosis Date Noted  . Left anterior cruciate ligament tear 09/14/2020  . Acute medial meniscus tear of left knee 09/14/2020   Past Medical History:  Diagnosis Date  . ACL tear     History reviewed. No pertinent family history.  Past Surgical History:  Procedure Laterality Date  . KNEE ARTHROSCOPY WITH ANTERIOR CRUCIATE LIGAMENT (ACL) REPAIR WITH HAMSTRING GRAFT Left 09/24/2020   Procedure: LEFT KNEE ARTHROSCOPY WITH ANTERIOR CRUCIATE LIGAMENT (ACL) RECONSTRUCTION, PARTIAL MEDIAL MENISCUS REPAIR;  Surgeon: Leandrew Koyanagi, MD;  Location: [place];  Service: Orthopedics;  Laterality: Left;  . NO PAST SURGERIES     Social History   Occupational History  . Not on file  Tobacco Use  . Smoking status: Never Smoker  . Smokeless tobacco: Never Used  Vaping Use  . Vaping Use: Never used  Substance and Sexual Activity  . Alcohol use: Yes    Comment: occas  . Drug use: Never  . Sexual activity: Yes

## 2020-12-16 NOTE — Telephone Encounter (Signed)
yes

## 2020-12-16 NOTE — Telephone Encounter (Signed)
Ok for note 

## 2020-12-16 NOTE — Telephone Encounter (Signed)
Pt called and informed.

## 2020-12-16 NOTE — Telephone Encounter (Signed)
Note completed 

## 2020-12-20 NOTE — Telephone Encounter (Signed)
patient called she is requesting rx refill for hydrocodone and tylenol. CB:[phone]

## 2020-12-20 NOTE — Telephone Encounter (Signed)
Placed up front. Pt informed

## 2020-12-20 NOTE — Telephone Encounter (Signed)
Please advise 

## 2020-12-20 NOTE — Telephone Encounter (Signed)
I called and advised the patient. No h/o seizures. However, she does have a bottle of Tramadol -- she said it makes her "itchy" and it does not help the pain, especially at night. The patient is taking 2 meloxicam 7.5 mg  tabs daily for the inflammation, which does help. She said she was taking the hydrocodone every other night for the more severe pain. Please advise.

## 2020-12-20 NOTE — Telephone Encounter (Signed)
Cannot refill norco.  Can you call in tramadol 50mg  1 every 8 hours prn pain #30 as long as no hx of seizures

## 2020-12-21 NOTE — Telephone Encounter (Signed)
I just sent in tylenol #3

## 2021-01-11 NOTE — Telephone Encounter (Signed)
Please advise 

## 2021-01-26 DIAGNOSIS — S83512D Sprain of anterior cruciate ligament of left knee, subsequent encounter: Secondary | ICD-10-CM

## 2021-01-26 DIAGNOSIS — S83242D Other tear of medial meniscus, current injury, left knee, subsequent encounter: Secondary | ICD-10-CM | POA: Diagnosis not present

## 2021-01-26 NOTE — Progress Notes (Signed)
   Post-Op Visit Note   Patient: Kelly Vasquez           Date of Birth: [birth year]           MRN: [id number] Visit Date: 01/26/2021 PCP: Avel Sensor, MD   Assessment & Plan:  Chief Complaint:  Chief Complaint  Patient presents with  . Left Knee - Pain, Edema   Visit Diagnoses:  1. Rupture of anterior cruciate ligament of left knee, subsequent encounter   2. Acute medial meniscus tear of left knee, subsequent encounter     Plan: Patient is a pleasant 30 year old female who comes in today with concerns about her left knee.  She is approximately 4 months out left knee ACL reconstruction with quadriceps graft as well as medial meniscus repair.  She has had intermittent pain and swelling since with a squeaking sound coming from her knee.  She has been seen by [place] multiple times for this with repeat MRI.  She has had a few cortisone injections as well with the last one being almost 2 months ago.  She thinks this did help quite a bit.  She notes that about 3 days ago she had increased swelling to the left knee that was out of the blue.  She applied ice and rested her knee which did seem to help.  She has been taking Mobic and Tylenol 3 with relief as well.  Examination of her left knee reveals a trace effusion.  Range of motion 0 to 115 degrees.  No joint line tenderness.  Ligaments are stable.  She is neurovascular intact distally.  At this point, I do not recommend reaspiration or injection.  She will continue to ice and elevate when needed.  Continue current work restrictions and follow-up with [place] in 2 months time for recheck.  Call with concerns or questions.  Follow-Up Instructions: Return in about 2 months (around 03/26/2021).   Orders:  No orders of the defined types were placed in this encounter.  No orders of the defined types were placed in this encounter.   Imaging: No new imaging  PMFS History: Patient Active Problem List   Diagnosis Date Noted  . Left anterior cruciate  ligament tear 09/14/2020  . Acute medial meniscus tear of left knee 09/14/2020   Past Medical History:  Diagnosis Date  . ACL tear     No family history on file.  Past Surgical History:  Procedure Laterality Date  . KNEE ARTHROSCOPY WITH ANTERIOR CRUCIATE LIGAMENT (ACL) REPAIR WITH HAMSTRING GRAFT Left 09/24/2020   Procedure: LEFT KNEE ARTHROSCOPY WITH ANTERIOR CRUCIATE LIGAMENT (ACL) RECONSTRUCTION, PARTIAL MEDIAL MENISCUS REPAIR;  Surgeon: Leandrew Koyanagi, MD;  Location: [facility];  Service: Orthopedics;  Laterality: Left;  . NO PAST SURGERIES     Social History   Occupational History  . Not on file  Tobacco Use  . Smoking status: Never Smoker  . Smokeless tobacco: Never Used  Vaping Use  . Vaping Use: Never used  Substance and Sexual Activity  . Alcohol use: Yes    Comment: occas  . Drug use: Never  . Sexual activity: Yes

## 2021-02-13 DIAGNOSIS — F331 Major depressive disorder, recurrent, moderate: Secondary | ICD-10-CM | POA: Diagnosis not present

## 2021-02-13 DIAGNOSIS — F419 Anxiety disorder, unspecified: Secondary | ICD-10-CM | POA: Insufficient documentation

## 2021-02-13 DIAGNOSIS — F4321 Adjustment disorder with depressed mood: Secondary | ICD-10-CM

## 2021-02-13 IMAGING — MR MR KNEE*L* W/O CM
7 series · 40 of 40 positions shown · non-contrast
Comparison: 09/03/2020

CLINICAL DATA: Complications after knee surgery. Status post ACL
and meniscal repair.

EXAM:
MRI OF THE LEFT KNEE WITHOUT CONTRAST
TECHNIQUE: Multiplanar, multisequence MR imaging of the knee was performed. No
intravenous contrast was administered.

[Series 3: T2 fat-sat · axial · 4.0mm · 0.53mm/px · z∈[-93,+77]mm · 6 of 35 slices shown (1 of 3)]
[im 1/35]
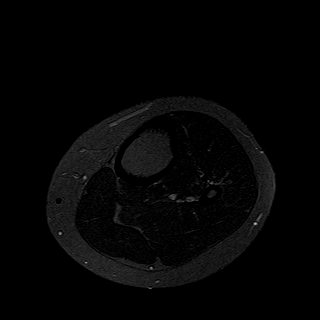
[im 7/35]
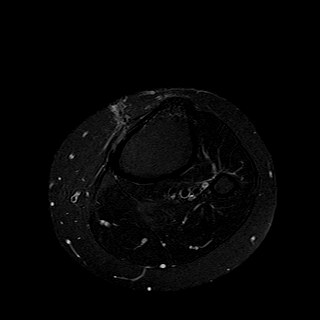
[im 14/35]
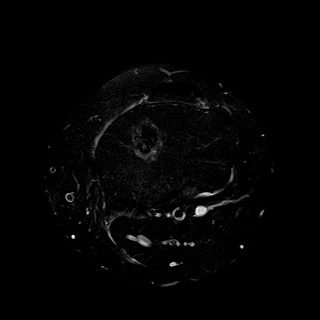
[im 21/35]
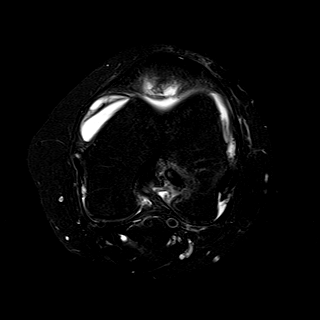
[im 28/35]
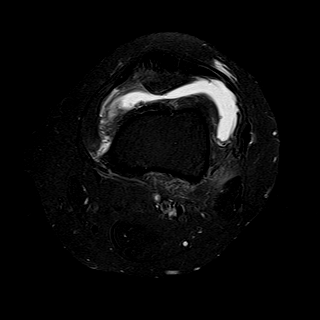
[im 35/35]
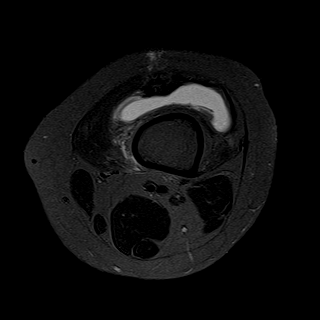

[Series 4: T1 · coronal · 4.0mm · 0.62mm/px · 5 of 29 slices shown]
[im 1/29]
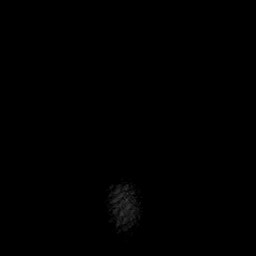
[im 8/29]
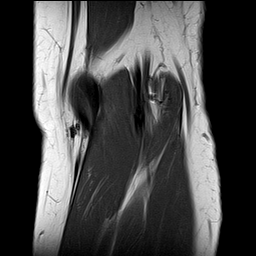
[im 15/29]
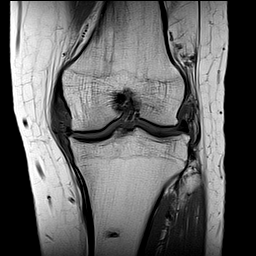
[im 22/29]
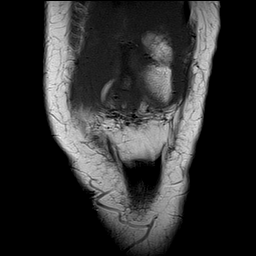
[im 29/29]
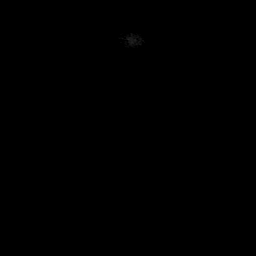

[Series 5: T2 fat-sat · coronal · 4.0mm · 0.62mm/px · 6 of 29 slices shown (2 of 3)]
[im 1/29]
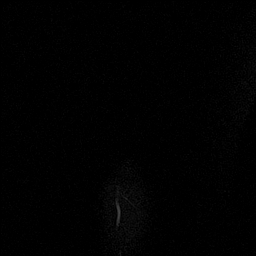
[im 6/29]
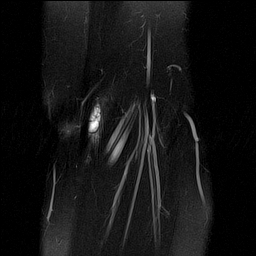
[im 12/29]
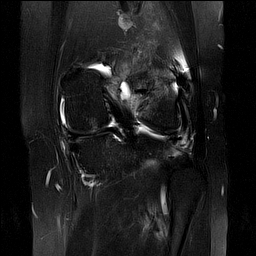
[im 17/29]
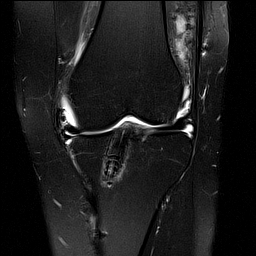
[im 23/29]
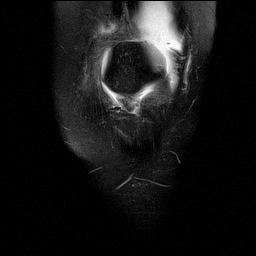
[im 29/29]
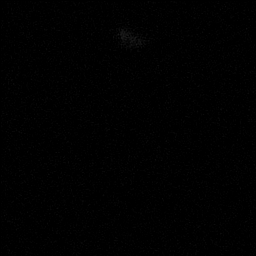

[Series 6: PD fat-sat · coronal · 4.0mm · 0.62mm/px · 6 of 29 slices shown (1 of 3)]
[im 1/29]
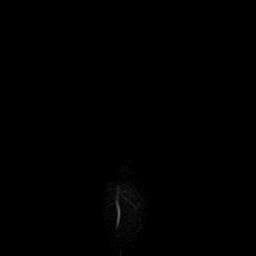
[im 6/29]
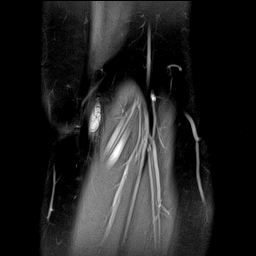
[im 12/29]
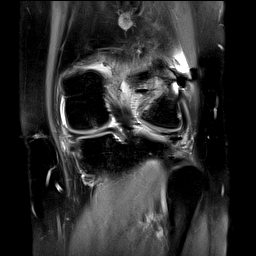
[im 17/29]
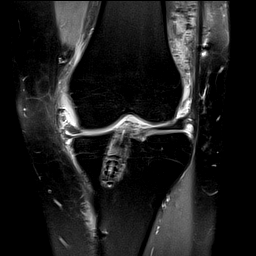
[im 23/29]
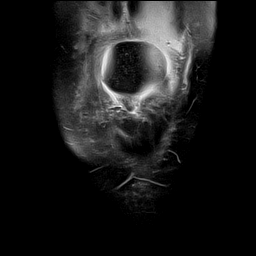
[im 29/29]
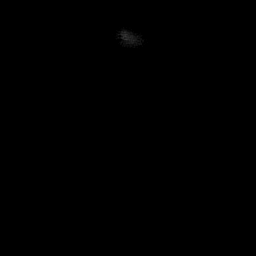

[Series 7: PD fat-sat · sagittal · 3.0mm · 0.62mm/px · 7 of 35 slices shown (2 of 3)]
[im 1/35]
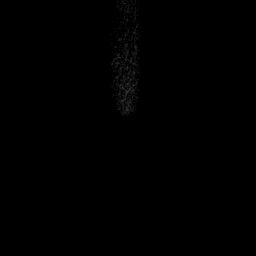
[im 6/35]
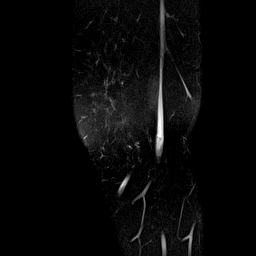
[im 12/35]
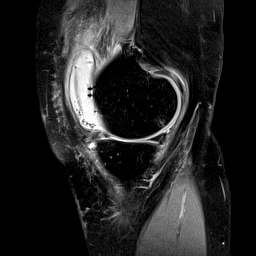
[im 18/35]
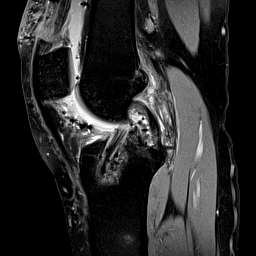
[im 23/35]
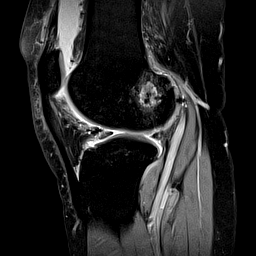
[im 29/35]
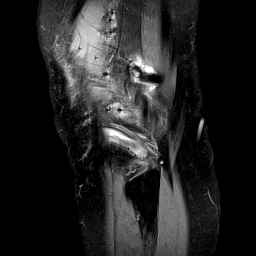
[im 35/35]
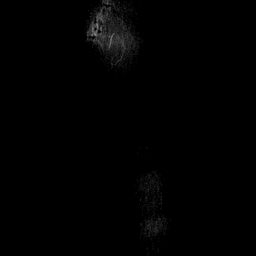

[Series 8: T2 fat-sat · sagittal · 3.0mm · 0.62mm/px · 7 of 35 slices shown (3 of 3)]
[im 1/35]
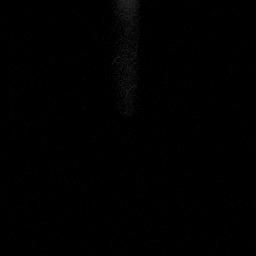
[im 6/35]
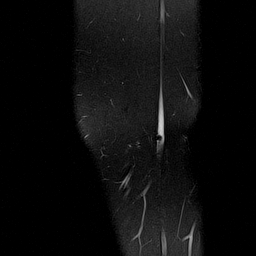
[im 12/35]
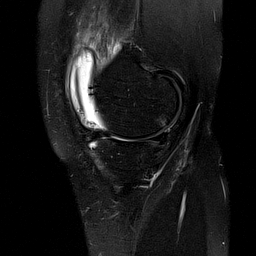
[im 18/35]
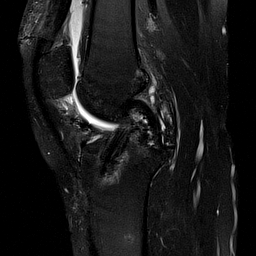
[im 23/35]
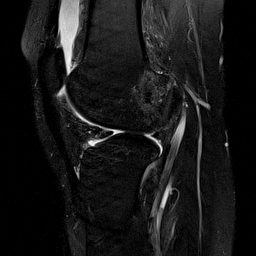
[im 29/35]
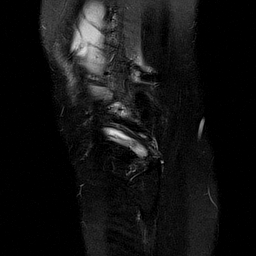
[im 35/35]
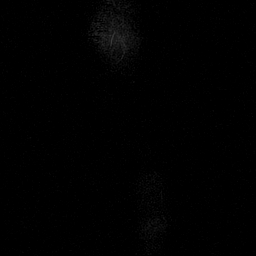

[Series 9: PD fat-sat · coronal · 2.0mm · 0.62mm/px · 3 of 18 slices shown (3 of 3)]
[im 1/18]
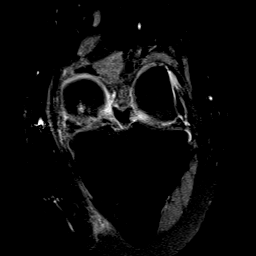
[im 9/18]
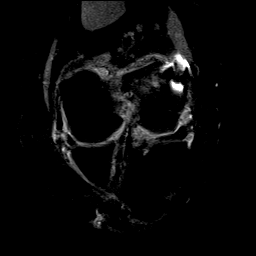
[im 18/18]
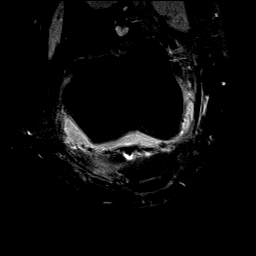

[40 of 40 positions shown; findings below may reference images not displayed]

FINDINGS: MENISCI

Medial: Attenuation of the body of the medial meniscus consistent
with prior partial meniscectomy. Attenuation of the anterior horn of
the medial meniscus likely reflecting prior meniscectomy.

Lateral: Severe attenuation of the posterior horn of the lateral
meniscus consistent with prior meniscectomy.

LIGAMENTS

Cruciates: Prior ACL repair with the ACL graft intact.  Intact PCL.

Collaterals: Medial collateral ligament is intact. Lateral
collateral ligament complex is intact.

CARTILAGE

Patellofemoral:  No chondral defect.

Medial: High-grade partial-thickness cartilage loss of the medial
femorotibial compartment with subchondral reactive marrow changes.

Lateral: Partial thickness cartilage loss of the lateral
femorotibial compartment.

JOINT: Large joint effusion. Foci of susceptibility in the
extra-articular soft tissues and within the joint. Edema in Hoffa's
fat with postsurgical changes in Hoffa's fat. No plical thickening.

POPLITEAL FOSSA: Popliteus tendon is intact. Small Baker's cyst.

EXTENSOR MECHANISM: Intact quadriceps tendon with postsurgical
changes along the distal midportion of the quadriceps tendon. Intact
patellar tendon. Intact lateral patellar retinaculum. Intact medial
patellar retinaculum. Intact MPFL.

BONES: No aggressive osseous lesion. No fracture or dislocation.

Other: No fluid collection or hematoma. Muscles are normal.
IMPRESSION: 1. Prior ACL repair with the ACL graft intact.
2. Attenuation of the body of the medial meniscus consistent with
prior partial meniscectomy. Attenuation of the anterior horn of the
medial meniscus likely reflecting prior meniscectomy.
3. Severe attenuation of the posterior horn of the lateral meniscus
consistent with prior meniscectomy.
4. High-grade partial-thickness cartilage loss of the medial
femorotibial compartment with subchondral reactive marrow changes.
5. Partial thickness cartilage loss of the lateral femorotibial
compartment.
6. Large joint effusion. Foci of susceptibility in the
extra-articular soft tissues and within the joint likely
postsurgical.

## 2021-02-13 NOTE — Discharge Instructions (Addendum)
Take all medications as prescribed. Keep all follow-up appointments as scheduled.  Do not consume alcohol or use illegal drugs while on prescription medications. Report any adverse effects from your medications to your primary care provider promptly.  In the event of recurrent symptoms or worsening symptoms, call 911, a crisis hotline, or go to the nearest emergency department for evaluation.   

## 2021-02-13 NOTE — ED Notes (Signed)
Patient A&O x 4, ambulatory. Patient discharged in no acute distress. Patient denied SI/HI, A/VH upon discharge. Patient verbalized understanding of all discharge instructions explained by staff, to include follow up appointments and safety plan. Pt belongings returned to patient from locker colored blue intact to include blue pocketbook. Patient escorted to lobby via staff for transport to destination via pt's brother. Safety maintained.
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
## 2021-07-12 DIAGNOSIS — K0889 Other specified disorders of teeth and supporting structures: Secondary | ICD-10-CM | POA: Diagnosis not present

## 2021-07-12 NOTE — Discharge Instructions (Addendum)
Apply ice or heat, whichever feels better.  Finish the Augmentin, even if you feel better.  Take the ibuprofen with 1000 mg of Tylenol for mild to moderate pain or ibuprofen with 1-2 Norco for severe pain.  Follow-up with your dentist or urgent tooth.  They may do some tooth extractions.

## 2021-07-12 NOTE — ED Triage Notes (Signed)
Pt c/o impacted wisdom tooth on her left bottom side with an onset of pain yesterday. She notes that her dentist is currently OOO. Pt c/o pain with chewing. Has been taking tylenol with temporary relief. Also used a cold compress.

## 2021-07-12 NOTE — ED Provider Notes (Signed)
HPI  SUBJECTIVE:  Kelly Vasquez is a 30 y.o. female who presents with left lower wisdom tooth pain starting 1 week ago.  It became constant yesterday.  She describes it as throbbing, unable to sleep last night, and is unable to eat due to the pain.  She states that this tooth is known to be impacted for some time, and was supposed to have it removed, however, her dentist has contracted COVID and is out for the next 2 weeks.  No fevers, facial swelling, trismus, sore throat, drooling, tooth trauma, gingival drainage.  She reports localized gingival swelling.  She tried 500 mg of Tylenol and ice with improvement in her symptoms.  Symptoms are worse with eating.  She has no past medical history.  She denies possibility of being pregnant.  [facility]ist: [facility].    Past Medical History:  Diagnosis Date   ACL tear     Past Surgical History:  Procedure Laterality Date   KNEE ARTHROSCOPY WITH ANTERIOR CRUCIATE LIGAMENT (ACL) REPAIR WITH HAMSTRING GRAFT Left 09/24/2020   Procedure: LEFT KNEE ARTHROSCOPY WITH ANTERIOR CRUCIATE LIGAMENT (ACL) RECONSTRUCTION, PARTIAL MEDIAL MENISCUS REPAIR;  Surgeon: Leandrew Koyanagi, MD;  Location: [place];  Service: Orthopedics;  Laterality: Left;   NO PAST SURGERIES      History reviewed. No pertinent family history.  Social History   Tobacco Use   Smoking status: Never   Smokeless tobacco: Never  Vaping Use   Vaping Use: Never used  Substance Use Topics   Alcohol use: Yes    Comment: occas   Drug use: Never    No current facility-administered medications for this encounter.  Current Outpatient Medications:    amoxicillin-clavulanate (AUGMENTIN) 875-125 MG tablet, Take 1 tablet by mouth 2 (two) times daily for 7 days., Disp: 14 tablet, Rfl: 0   HYDROcodone-acetaminophen (NORCO/VICODIN) 5-325 MG tablet, Take 1-2 tablets by mouth every 6 (six) hours as needed for moderate pain or severe pain., Disp: 12 tablet, Rfl: 0   ibuprofen (ADVIL)  600 MG tablet, Take 1 tablet (600 mg total) by mouth every 6 (six) hours as needed., Disp: 30 tablet, Rfl: 0   influenza vac split quadrivalent PF (FLUARIX) 0.5 ML injection, TO BE ADMINISTERED BY THE PHARMACIST, Disp: .5 mL, Rfl: 0  Allergies  Allergen Reactions   Tramadol Itching     ROS  As noted in HPI.   Physical Exam  BP 120/85 (BP Location: Left Arm)   Pulse 75   Temp 98.6 F (37 C) (Oral)   Resp 18   SpO2 98%   Constitutional: Well developed, well nourished, no acute distress Eyes:  EOMI, conjunctiva normal bilaterally HENT: Normocephalic, atraumatic,mucus membranes moist.  No facial swelling.  Positive tenderness along the rear left lower gumline.  Positive swelling.  Tooth has not yet erupted.  Rest of the teeth nontender, normal.  No swelling under the tongue.  No drooling, trismus. Lymph: No submental, cervical lymphadenopathy. Respiratory: Normal inspiratory effort Cardiovascular: Normal rate GI: nondistended skin: No rash, skin intact Musculoskeletal: no deformities Neurologic: Alert & oriented x 3, no focal neuro deficits Psychiatric: Speech and behavior appropriate   ED Course   Medications - No data to display  No orders of the defined types were placed in this encounter.   No results found for this or any previous visit (from the past 24 hour(s)). No results found.  ED Clinical Impression  1. Pain, dental      ED Assessment/Plan  [facility]  database reviewed for this patient, and feel that the risk/benefit ratio today is favorable for proceeding with a prescription for controlled substance.  Patient with narcotic prescriptions from 2 providers from August to December 21, none since.  States that she was on the narcotics for knee surgery last year.  Patient with impacted wisdom tooth.  There may be an occult infection.  We will send home with Augmentin, ibuprofen/Tylenol containing product 3-4 times a day as needed for pain.  Either  ibuprofen/1000 g of Tylenol for mild to moderate pain or ibuprofen/Norco for severe pain.  She is to follow-up with her dentist or may follow-up with urgent tooth.  Discussed  MDM, treatment plan, and plan for follow-up with patient.patient agrees with plan.   Meds ordered this encounter  Medications   amoxicillin-clavulanate (AUGMENTIN) 875-125 MG tablet    Sig: Take 1 tablet by mouth 2 (two) times daily for 7 days.    Dispense:  14 tablet    Refill:  0   ibuprofen (ADVIL) 600 MG tablet    Sig: Take 1 tablet (600 mg total) by mouth every 6 (six) hours as needed.    Dispense:  30 tablet    Refill:  0   HYDROcodone-acetaminophen (NORCO/VICODIN) 5-325 MG tablet    Sig: Take 1-2 tablets by mouth every 6 (six) hours as needed for moderate pain or severe pain.    Dispense:  12 tablet    Refill:  0      *This clinic note was created using [employer]. Therefore, there may be occasional mistakes despite careful proofreading.  ?    Melynda Ripple, MD 07/14/21 1126

## 2021-10-04 DIAGNOSIS — Z9889 Other specified postprocedural states: Secondary | ICD-10-CM

## 2021-10-04 NOTE — Progress Notes (Signed)
   Post-Op Visit Note   Patient: Kelly Vasquez           Date of Birth: [birth year]           MRN: [id number] Visit Date: 10/04/2021 PCP: Avel Sensor, MD   Assessment & Plan:  Chief Complaint:  Chief Complaint  Patient presents with   Left Knee - Numbness   Visit Diagnoses:  1. S/P ACL reconstruction     Plan: Patient is a pleasant 30 year old female who comes in today 1 year out left knee ACL reconstruction with quadriceps autograft and medial meniscus repair, date of surgery 09/24/2021.  She has been doing okay.  Her main concern is decreased sensation to the anterior tibia.  She notes that soon after surgery, she had decreased sensation primarily to the entire lower which has improved with time.  She is also complaining of some occasional stiffness.  Overall, doing much better.  She is no longer experiencing the squeaking sensation she was once having.  Of note, she only went to about 6 physical therapy sessions as it appears that her insurance was dropped at some point due to not returning to work.  She is now employed by [employer] and has new insurance.  She is interested in returning to physical therapy.  Examination of the left knee reveals fully healed surgical scars without complication.  Range of motion 0 to 125 degrees.  No joint line tenderness.  Stable to anterior drawer testing.  4 out of 5 strength with resisted straight leg raise.  At this point, I agree that she would benefit from more physical therapy.  I have provided her with a hardcopy prescription today.  Reassurance was provided about the decrease in station to the shin that this could be from the incisions and that the sensation may or may not return.  It is assuring that motor sensation is not involved.  She will follow-up with [place] as needed.  Call with concerns or questions in the meantime.  Follow-Up Instructions: Return if symptoms worsen or fail to improve.   Orders:  No orders of the defined types were placed in  this encounter.  No orders of the defined types were placed in this encounter.   Imaging: No new imaging  PMFS History: Patient Active Problem List   Diagnosis Date Noted   Left anterior cruciate ligament tear 09/14/2020   Acute medial meniscus tear of left knee 09/14/2020   Past Medical History:  Diagnosis Date   ACL tear     No family history on file.  Past Surgical History:  Procedure Laterality Date   KNEE ARTHROSCOPY WITH ANTERIOR CRUCIATE LIGAMENT (ACL) REPAIR WITH HAMSTRING GRAFT Left 09/24/2020   Procedure: LEFT KNEE ARTHROSCOPY WITH ANTERIOR CRUCIATE LIGAMENT (ACL) RECONSTRUCTION, PARTIAL MEDIAL MENISCUS REPAIR;  Surgeon: Leandrew Koyanagi, MD;  Location: [place];  Service: Orthopedics;  Laterality: Left;   NO PAST SURGERIES     Social History   Occupational History   Not on file  Tobacco Use   Smoking status: Never   Smokeless tobacco: Never  Vaping Use   Vaping Use: Never used  Substance and Sexual Activity   Alcohol use: Yes    Comment: occas   Drug use: Never   Sexual activity: Yes
